# Patient Record
Sex: Female | Born: 1935 | Race: White | Hispanic: No | Smoking: Never smoker
Health system: Southern US, Community
[De-identification: ages and names within clinical notes are randomized; demographics above are authoritative.]

## PROBLEM LIST (undated history)

## (undated) DIAGNOSIS — F0390 Unspecified dementia without behavioral disturbance: Secondary | ICD-10-CM

## (undated) DIAGNOSIS — F32A Depression, unspecified: Secondary | ICD-10-CM

## (undated) DIAGNOSIS — T17908A Unspecified foreign body in respiratory tract, part unspecified causing other injury, initial encounter: Secondary | ICD-10-CM

## (undated) DIAGNOSIS — I4891 Unspecified atrial fibrillation: Secondary | ICD-10-CM

## (undated) DIAGNOSIS — K2970 Gastritis, unspecified, without bleeding: Secondary | ICD-10-CM

## (undated) DIAGNOSIS — F329 Major depressive disorder, single episode, unspecified: Secondary | ICD-10-CM

## (undated) DIAGNOSIS — D649 Anemia, unspecified: Secondary | ICD-10-CM

---

## 2003-06-30 ENCOUNTER — Other Ambulatory Visit: Payer: Self-pay

## 2004-05-20 ENCOUNTER — Ambulatory Visit: Payer: Self-pay | Admitting: Internal Medicine

## 2004-12-05 ENCOUNTER — Ambulatory Visit: Payer: Self-pay | Admitting: Internal Medicine

## 2004-12-17 ENCOUNTER — Ambulatory Visit: Payer: Self-pay | Admitting: Internal Medicine

## 2004-12-27 ENCOUNTER — Ambulatory Visit: Payer: Self-pay | Admitting: Internal Medicine

## 2005-01-26 ENCOUNTER — Ambulatory Visit: Payer: Self-pay | Admitting: Internal Medicine

## 2005-05-22 ENCOUNTER — Ambulatory Visit: Payer: Self-pay | Admitting: Internal Medicine

## 2005-07-14 ENCOUNTER — Other Ambulatory Visit: Payer: Self-pay

## 2005-07-14 ENCOUNTER — Ambulatory Visit: Payer: Self-pay | Admitting: Orthopaedic Surgery

## 2005-07-16 ENCOUNTER — Ambulatory Visit: Payer: Self-pay | Admitting: Orthopaedic Surgery

## 2005-11-20 ENCOUNTER — Ambulatory Visit: Payer: Self-pay | Admitting: Rheumatology

## 2006-02-04 ENCOUNTER — Ambulatory Visit: Payer: Self-pay | Admitting: Anesthesiology

## 2006-03-24 ENCOUNTER — Ambulatory Visit: Payer: Self-pay | Admitting: Unknown Physician Specialty

## 2006-05-22 ENCOUNTER — Ambulatory Visit: Payer: Self-pay | Admitting: Internal Medicine

## 2007-03-08 ENCOUNTER — Ambulatory Visit: Payer: Self-pay | Admitting: Family

## 2007-06-10 ENCOUNTER — Ambulatory Visit: Payer: Self-pay | Admitting: Unknown Physician Specialty

## 2007-06-22 ENCOUNTER — Ambulatory Visit: Payer: Self-pay | Admitting: Unknown Physician Specialty

## 2007-06-30 ENCOUNTER — Ambulatory Visit: Payer: Self-pay | Admitting: Internal Medicine

## 2007-09-16 ENCOUNTER — Ambulatory Visit: Payer: Self-pay | Admitting: Family

## 2008-07-03 ENCOUNTER — Ambulatory Visit: Payer: Self-pay | Admitting: Internal Medicine

## 2008-09-06 ENCOUNTER — Encounter: Payer: Self-pay | Admitting: Family Medicine

## 2008-09-26 ENCOUNTER — Encounter: Payer: Self-pay | Admitting: Family Medicine

## 2008-10-26 ENCOUNTER — Encounter: Payer: Self-pay | Admitting: Family Medicine

## 2009-07-04 ENCOUNTER — Ambulatory Visit: Payer: Self-pay | Admitting: Internal Medicine

## 2009-08-29 ENCOUNTER — Inpatient Hospital Stay: Payer: Self-pay | Admitting: Internal Medicine

## 2009-09-13 ENCOUNTER — Ambulatory Visit: Payer: Self-pay | Admitting: Unknown Physician Specialty

## 2009-10-11 ENCOUNTER — Ambulatory Visit: Payer: Self-pay | Admitting: Internal Medicine

## 2009-12-13 ENCOUNTER — Ambulatory Visit: Payer: Self-pay | Admitting: Unknown Physician Specialty

## 2010-01-14 ENCOUNTER — Ambulatory Visit: Payer: Self-pay | Admitting: Pain Medicine

## 2010-01-23 ENCOUNTER — Other Ambulatory Visit: Payer: Self-pay | Admitting: Pain Medicine

## 2010-01-23 ENCOUNTER — Ambulatory Visit: Payer: Self-pay | Admitting: Pain Medicine

## 2010-02-21 ENCOUNTER — Ambulatory Visit: Payer: Self-pay | Admitting: Pain Medicine

## 2010-03-04 ENCOUNTER — Ambulatory Visit: Payer: Self-pay | Admitting: Pain Medicine

## 2010-03-04 ENCOUNTER — Other Ambulatory Visit: Payer: Self-pay | Admitting: Pain Medicine

## 2010-03-25 ENCOUNTER — Ambulatory Visit: Payer: Self-pay | Admitting: Pain Medicine

## 2010-04-01 ENCOUNTER — Ambulatory Visit: Payer: Self-pay | Admitting: Pain Medicine

## 2010-04-05 ENCOUNTER — Ambulatory Visit: Payer: Self-pay | Admitting: Unknown Physician Specialty

## 2010-05-02 ENCOUNTER — Ambulatory Visit: Payer: Self-pay | Admitting: Pain Medicine

## 2010-05-08 ENCOUNTER — Ambulatory Visit: Payer: Self-pay | Admitting: Pain Medicine

## 2010-06-13 ENCOUNTER — Ambulatory Visit: Payer: Self-pay | Admitting: Pain Medicine

## 2010-06-26 ENCOUNTER — Encounter: Payer: Self-pay | Admitting: Pain Medicine

## 2010-06-27 ENCOUNTER — Encounter: Payer: Self-pay | Admitting: Pain Medicine

## 2010-07-01 ENCOUNTER — Ambulatory Visit: Payer: Self-pay | Admitting: Pain Medicine

## 2010-07-17 ENCOUNTER — Ambulatory Visit: Payer: Self-pay | Admitting: Internal Medicine

## 2010-07-19 ENCOUNTER — Ambulatory Visit: Payer: Self-pay | Admitting: Family Medicine

## 2010-07-30 ENCOUNTER — Ambulatory Visit: Payer: Self-pay | Admitting: Pain Medicine

## 2010-08-29 ENCOUNTER — Ambulatory Visit: Payer: Self-pay | Admitting: Pain Medicine

## 2010-09-02 ENCOUNTER — Ambulatory Visit: Payer: Self-pay | Admitting: Pain Medicine

## 2010-09-26 ENCOUNTER — Ambulatory Visit: Payer: Self-pay | Admitting: Pain Medicine

## 2010-11-07 ENCOUNTER — Ambulatory Visit: Payer: Self-pay | Admitting: Pain Medicine

## 2010-11-20 ENCOUNTER — Ambulatory Visit: Payer: Self-pay | Admitting: Pain Medicine

## 2010-12-24 ENCOUNTER — Ambulatory Visit: Payer: Self-pay | Admitting: Pain Medicine

## 2011-01-21 ENCOUNTER — Ambulatory Visit: Payer: Self-pay | Admitting: Pain Medicine

## 2011-01-27 ENCOUNTER — Ambulatory Visit: Payer: Self-pay | Admitting: Pain Medicine

## 2011-02-20 ENCOUNTER — Ambulatory Visit: Payer: Self-pay | Admitting: Pain Medicine

## 2011-03-03 ENCOUNTER — Ambulatory Visit: Payer: Self-pay | Admitting: Pain Medicine

## 2011-03-12 ENCOUNTER — Ambulatory Visit: Payer: Self-pay | Admitting: Pain Medicine

## 2011-03-25 ENCOUNTER — Ambulatory Visit: Payer: Self-pay | Admitting: Pain Medicine

## 2011-04-07 ENCOUNTER — Ambulatory Visit: Payer: Self-pay | Admitting: Pain Medicine

## 2011-05-22 ENCOUNTER — Ambulatory Visit: Payer: Self-pay | Admitting: Pain Medicine

## 2011-05-26 ENCOUNTER — Ambulatory Visit: Payer: Self-pay | Admitting: Pain Medicine

## 2011-06-19 ENCOUNTER — Ambulatory Visit: Payer: Self-pay | Admitting: Pain Medicine

## 2011-07-17 ENCOUNTER — Other Ambulatory Visit: Payer: Self-pay | Admitting: Pain Medicine

## 2011-07-17 ENCOUNTER — Ambulatory Visit: Payer: Self-pay | Admitting: Pain Medicine

## 2011-07-17 LAB — PROTIME-INR: INR: 1.2

## 2011-08-12 ENCOUNTER — Ambulatory Visit: Payer: Self-pay | Admitting: Internal Medicine

## 2011-08-19 ENCOUNTER — Ambulatory Visit: Payer: Self-pay | Admitting: Pain Medicine

## 2011-09-16 ENCOUNTER — Ambulatory Visit: Payer: Self-pay | Admitting: Pain Medicine

## 2011-09-17 ENCOUNTER — Ambulatory Visit: Payer: Self-pay | Admitting: Internal Medicine

## 2011-09-26 ENCOUNTER — Ambulatory Visit: Payer: Self-pay | Admitting: Surgery

## 2011-10-02 ENCOUNTER — Ambulatory Visit: Payer: Self-pay | Admitting: Internal Medicine

## 2011-10-02 LAB — PROTIME-INR: Prothrombin Time: 27.1 secs — ABNORMAL HIGH (ref 11.5–14.7)

## 2011-10-02 LAB — CBC CANCER CENTER
Basophil %: 0.2 %
HCT: 42.8 % (ref 35.0–47.0)
HGB: 14.4 g/dL (ref 12.0–16.0)
Lymphocyte #: 1.1 x10 3/mm (ref 1.0–3.6)
Lymphocyte %: 18.8 %
MCV: 100 fL (ref 80–100)
Neutrophil %: 71.3 %
Platelet: 175 x10 3/mm (ref 150–440)
RBC: 4.29 10*6/uL (ref 3.80–5.20)
RDW: 13.7 % (ref 11.5–14.5)

## 2011-10-02 LAB — HEPATIC FUNCTION PANEL A (ARMC)
Bilirubin,Total: 0.4 mg/dL (ref 0.2–1.0)
SGOT(AST): 44 U/L — ABNORMAL HIGH (ref 15–37)
SGPT (ALT): 52 U/L

## 2011-10-02 LAB — APTT: Activated PTT: 38.6 secs — ABNORMAL HIGH (ref 23.6–35.9)

## 2011-10-02 LAB — CREATININE, SERUM
Creatinine: 0.69 mg/dL (ref 0.60–1.30)
EGFR (African American): >60

## 2011-10-03 LAB — CANCER ANTIGEN 19-9: CA 19-9: 32 U/mL (ref 0–35)

## 2011-10-22 ENCOUNTER — Other Ambulatory Visit: Payer: Self-pay | Admitting: Pain Medicine

## 2011-10-22 ENCOUNTER — Ambulatory Visit: Payer: Self-pay | Admitting: Pain Medicine

## 2011-10-22 LAB — PROTIME-INR: Prothrombin Time: 13 secs (ref 11.5–14.7)

## 2011-10-27 ENCOUNTER — Ambulatory Visit: Payer: Self-pay | Admitting: Internal Medicine

## 2011-11-24 ENCOUNTER — Ambulatory Visit: Payer: Self-pay | Admitting: Surgery

## 2011-11-24 LAB — PROTIME-INR
INR: 0.9
Prothrombin Time: 12.3 secs (ref 11.5–14.7)

## 2011-11-25 LAB — HEMOGLOBIN: HGB: 13.2 g/dL (ref 12.0–16.0)

## 2011-11-25 LAB — PATHOLOGY REPORT

## 2012-01-06 ENCOUNTER — Ambulatory Visit: Payer: Self-pay | Admitting: Pain Medicine

## 2012-01-14 ENCOUNTER — Ambulatory Visit: Payer: Self-pay | Admitting: Pain Medicine

## 2012-02-10 ENCOUNTER — Ambulatory Visit: Payer: Self-pay | Admitting: Pain Medicine

## 2012-03-01 ENCOUNTER — Encounter: Payer: Self-pay | Admitting: Pain Medicine

## 2012-03-16 ENCOUNTER — Ambulatory Visit: Payer: Self-pay | Admitting: Pain Medicine

## 2012-03-28 ENCOUNTER — Encounter: Payer: Self-pay | Admitting: Pain Medicine

## 2012-04-15 ENCOUNTER — Ambulatory Visit: Payer: Self-pay | Admitting: Pain Medicine

## 2012-07-15 ENCOUNTER — Ambulatory Visit: Payer: Self-pay | Admitting: Pain Medicine

## 2012-10-20 ENCOUNTER — Ambulatory Visit: Payer: Self-pay

## 2013-01-18 ENCOUNTER — Ambulatory Visit: Payer: Self-pay | Admitting: Pain Medicine

## 2013-01-27 ENCOUNTER — Ambulatory Visit: Payer: Self-pay | Admitting: Pain Medicine

## 2013-03-01 ENCOUNTER — Ambulatory Visit: Payer: Self-pay | Admitting: Pain Medicine

## 2013-09-04 ENCOUNTER — Emergency Department: Payer: Self-pay | Admitting: Emergency Medicine

## 2013-09-09 DIAGNOSIS — F0390 Unspecified dementia without behavioral disturbance: Secondary | ICD-10-CM | POA: Diagnosis not present

## 2013-09-09 DIAGNOSIS — S0990XA Unspecified injury of head, initial encounter: Secondary | ICD-10-CM | POA: Diagnosis not present

## 2013-09-09 DIAGNOSIS — S0530XA Ocular laceration without prolapse or loss of intraocular tissue, unspecified eye, initial encounter: Secondary | ICD-10-CM

## 2013-09-09 DIAGNOSIS — Z9181 History of falling: Secondary | ICD-10-CM | POA: Diagnosis not present

## 2013-12-28 ENCOUNTER — Ambulatory Visit: Payer: Self-pay | Admitting: Family Medicine

## 2014-08-15 NOTE — Op Note (Signed)
PATIENT NAME:  Jamie Norton, [place] MR#:  [id number] DATE OF BIRTH:  04-01-1922  DATE OF PROCEDURE:  11/24/2011  PREOPERATIVE DIAGNOSIS: Chronic cholecystitis, possible neoplasm.   POSTOPERATIVE DIAGNOSIS: Chronic cholecystitis, possible neoplasm.   PROCEDURE: Laparoscopic cholecystectomy, cholangiogram.   SURGEON:  Renda Rolls, M.D.   ANESTHESIA: General.   INDICATIONS: This 81 year old female recently has had some right upper quadrant pain, ultrasound findings of thickening gallbladder wall, and MRCP findings of thickening of the gallbladder wall with suspicion of neoplasm, but no invasion of the liver identified. There was some extrahepatic biliary dilatation, but no stones were seen. Surgery was recommended for definitive treatment.   DESCRIPTION OF PROCEDURE: The patient was placed on the operating table in the supine position under general endotracheal anesthesia. The abdomen was prepared with ChloraPrep and draped in a sterile manner.   A short incision was made below the umbilicus at the site of an old scar, carried down through subcutaneous tissues. The deep fascia was grasped with a laryngeal hook and elevated. A Veress needle was inserted, aspirated, and irrigated with a saline solution. Next, the peritoneal cavity was inflated with carbon dioxide. The Veress needle was removed. The 10-mm cannula was inserted. The 10- mm, 0-degree laparoscope was inserted to view the peritoneal cavity. The liver appeared smooth, stomach appeared typical. Another incision was made in the epigastrium slightly to the right of the midline to introduce an 11-mm cannula. Two incisions were made in the lateral aspect of the right upper quadrant to introduce two 5-mm cannulas.   There were some adhesions between the gallbladder and the anterior abdominal wall. These were taken down with blunt and sharp dissection and use of electrocautery. Next, the gallbladder was retracted towards the right shoulder. It did  appear to have a somewhat thickened wall. A number of additional adhesions were taken down with blunt and sharp dissection. The neck of the gallbladder was mobilized with incision of the visceral peritoneum. The common hepatic duct and common bile duct were identified. The cystic duct appeared to be somewhat large in size, approximately 5 millimeters. It was dissected free from surrounding structures. The cystic artery was dissected free from surrounding structures. A critical view of safety was demonstrated. An endoclip was placed across the cystic duct adjacent to the neck of the gallbladder. An incision was made in the cystic duct to introduce a Reddick catheter. Half-strength Conray-60 dye was injected as the cholangiogram was done with fluoroscopy. There was one film that appeared to have approximately a 1-cm lucency within the common bile duct; however, subsequent films did not demonstrate that lucency. There was some dilatation of the biliary tree. There was prompt flow of dye into the duodenum. Next, the Reddick catheter was removed. The cystic duct was doubly ligated with endoclips and divided. Next, chromic Endoloop was placed around the cystic duct and further ligated. The gallbladder was dissected free from the liver with hook and cautery. There was one small cystic structure which was adjacent to the gallbladder which was included in the resection. There was no tumor encountered during the course of this dissection. The gallbladder was completely separated. The site was irrigated with heparinized saline solution and aspirated. Hemostasis appeared to be intact. Next, the gallbladder was delivered up through the infraumbilical incision. It was necessary to lengthen the incision by about 12 mm to allow removal of the gallbladder. There was a palpable mass within the gallbladder. It appeared that there was no tumor seen on the outer surface of the  gallbladder. Next, the operative site was further inspected.  Hemostasis was intact. The cannulas were removed, allowing carbon dioxide to escape from the peritoneal cavity. The fascial defect at the umbilicus was closed with interrupted 0 Vicryl figure-of-eight sutures, and also the fascia at the epigastric port was closed with 0 Vicryl simple suture. All skin incisions were closed with interrupted 5-0 chromic subcuticular sutures, benzoin, and Steri-Strips. Dressings were applied with paper tape. The patient tolerated surgery satisfactorily and was then prepared for transfer to the recovery room.   The gallbladder was opened on the side table and did appear to have neoplasm within it and a stricture at the midportion of the gallbladder. It was submitted in formalin for routine pathology.   ____________________________ Shela CommonsJ. Renda RollsWilton Smith, MD [email or web address] D: 11/24/2011 09:18:22 ET T: 11/24/2011 12:40:27 ET JOB#: [id number]  cc: Adella HareJ. Wilton Smith, MD, <Dictator> Adella HareWILTON J SMITH MD ELECTRONICALLY SIGNED 11/29/2011 10:43

## 2014-09-20 ENCOUNTER — Inpatient Hospital Stay
Admission: EM | Admit: 2014-09-20 | Discharge: 2014-09-27 | DRG: 177 | Disposition: A | Discharge status: Skilled Nursing Facility | Payer: Medicare Other | Attending: Internal Medicine | Admitting: Internal Medicine

## 2014-09-20 ENCOUNTER — Encounter: Payer: Self-pay | Admitting: Emergency Medicine

## 2014-09-20 ENCOUNTER — Emergency Department: Payer: Medicare Other

## 2014-09-20 DIAGNOSIS — Z888 Allergy status to other drugs, medicaments and biological substances status: Secondary | ICD-10-CM

## 2014-09-20 DIAGNOSIS — E876 Hypokalemia: Secondary | ICD-10-CM | POA: Diagnosis present

## 2014-09-20 DIAGNOSIS — R0902 Hypoxemia: Secondary | ICD-10-CM | POA: Diagnosis present

## 2014-09-20 DIAGNOSIS — I4891 Unspecified atrial fibrillation: Secondary | ICD-10-CM | POA: Diagnosis present

## 2014-09-20 DIAGNOSIS — R1311 Dysphagia, oral phase: Secondary | ICD-10-CM | POA: Diagnosis present

## 2014-09-20 DIAGNOSIS — Z7901 Long term (current) use of anticoagulants: Secondary | ICD-10-CM

## 2014-09-20 DIAGNOSIS — Z515 Encounter for palliative care: Secondary | ICD-10-CM | POA: Diagnosis not present

## 2014-09-20 DIAGNOSIS — Z66 Do not resuscitate: Secondary | ICD-10-CM | POA: Diagnosis present

## 2014-09-20 DIAGNOSIS — Z881 Allergy status to other antibiotic agents status: Secondary | ICD-10-CM | POA: Diagnosis not present

## 2014-09-20 DIAGNOSIS — D649 Anemia, unspecified: Secondary | ICD-10-CM | POA: Diagnosis present

## 2014-09-20 DIAGNOSIS — F0390 Unspecified dementia without behavioral disturbance: Secondary | ICD-10-CM | POA: Diagnosis present

## 2014-09-20 DIAGNOSIS — F329 Major depressive disorder, single episode, unspecified: Secondary | ICD-10-CM | POA: Diagnosis present

## 2014-09-20 DIAGNOSIS — J449 Chronic obstructive pulmonary disease, unspecified: Secondary | ICD-10-CM | POA: Diagnosis present

## 2014-09-20 DIAGNOSIS — J9601 Acute respiratory failure with hypoxia: Secondary | ICD-10-CM | POA: Diagnosis not present

## 2014-09-20 DIAGNOSIS — J690 Pneumonitis due to inhalation of food and vomit: Principal | ICD-10-CM | POA: Diagnosis present

## 2014-09-20 DIAGNOSIS — K2970 Gastritis, unspecified, without bleeding: Secondary | ICD-10-CM | POA: Diagnosis present

## 2014-09-20 DIAGNOSIS — Z79899 Other long term (current) drug therapy: Secondary | ICD-10-CM

## 2014-09-20 DIAGNOSIS — J189 Pneumonia, unspecified organism: Secondary | ICD-10-CM

## 2014-09-20 DIAGNOSIS — R1313 Dysphagia, pharyngeal phase: Secondary | ICD-10-CM | POA: Diagnosis present

## 2014-09-20 DIAGNOSIS — E860 Dehydration: Secondary | ICD-10-CM | POA: Diagnosis present

## 2014-09-20 HISTORY — DX: Anemia, unspecified: D64.9

## 2014-09-20 HISTORY — DX: Unspecified dementia, unspecified severity, without behavioral disturbance, psychotic disturbance, mood disturbance, and anxiety: F03.90

## 2014-09-20 HISTORY — DX: Unspecified atrial fibrillation: I48.91

## 2014-09-20 HISTORY — DX: Depression, unspecified: F32.A

## 2014-09-20 HISTORY — DX: Major depressive disorder, single episode, unspecified: F32.9

## 2014-09-20 HISTORY — DX: Gastritis, unspecified, without bleeding: K29.70

## 2014-09-20 LAB — URINALYSIS COMPLETE WITH MICROSCOPIC (ARMC ONLY)
BILIRUBIN URINE: NEGATIVE
Bacteria, UA: NONE SEEN
Glucose, UA: 50 mg/dL — AB
Hgb urine dipstick: NEGATIVE
KETONES UR: NEGATIVE mg/dL
Nitrite: NEGATIVE
Protein, ur: 30 mg/dL — AB
Specific Gravity, Urine: 1.021 (ref 1.005–1.030)
pH: 6 (ref 5.0–8.0)

## 2014-09-20 LAB — CBC WITH DIFFERENTIAL/PLATELET
BASOS ABS: 0 10*3/uL (ref 0–0.1)
Basophils Relative: 0 %
Eosinophils Absolute: 0 10*3/uL (ref 0–0.7)
Eosinophils Relative: 0 %
HEMATOCRIT: 45.9 % (ref 35.0–47.0)
HEMOGLOBIN: 15.1 g/dL (ref 12.0–16.0)
Lymphocytes Relative: 1 %
Lymphs Abs: 0.2 10*3/uL — ABNORMAL LOW (ref 1.0–3.6)
MCH: 31.7 pg (ref 26.0–34.0)
MCHC: 32.9 g/dL (ref 32.0–36.0)
MCV: 96.5 fL (ref 80.0–100.0)
Monocytes Absolute: 0.4 10*3/uL (ref 0.2–0.9)
Monocytes Relative: 2 %
Neutro Abs: 14.2 10*3/uL — ABNORMAL HIGH (ref 1.4–6.5)
Neutrophils Relative %: 97 %
Platelets: 222 10*3/uL (ref 150–440)
RBC: 4.75 MIL/uL (ref 3.80–5.20)
RDW: 14.9 % — AB (ref 11.5–14.5)
WBC: 14.8 10*3/uL — ABNORMAL HIGH (ref 3.6–11.0)

## 2014-09-20 LAB — COMPREHENSIVE METABOLIC PANEL
ALT: 48 U/L (ref 14–54)
ANION GAP: 14 (ref 5–15)
AST: 73 U/L — ABNORMAL HIGH (ref 15–41)
Albumin: 4 g/dL (ref 3.5–5.0)
Alkaline Phosphatase: 120 U/L (ref 38–126)
BUN: 30 mg/dL — ABNORMAL HIGH (ref 6–20)
CO2: 32 mmol/L (ref 22–32)
CREATININE: 0.91 mg/dL (ref 0.44–1.00)
Calcium: 9.5 mg/dL (ref 8.9–10.3)
Chloride: 95 mmol/L — ABNORMAL LOW (ref 101–111)
GFR calc Af Amer: >60 mL/min (ref >60)
GFR calc non Af Amer: 54 mL/min — ABNORMAL LOW (ref >60)
GLUCOSE: 184 mg/dL — AB (ref 65–99)
POTASSIUM: 3 mmol/L — AB (ref 3.5–5.1)
Sodium: 141 mmol/L (ref 135–145)
TOTAL PROTEIN: 8 g/dL (ref 6.5–8.1)
Total Bilirubin: 0.6 mg/dL (ref 0.3–1.2)

## 2014-09-20 LAB — TROPONIN I: Troponin I: 0.03 ng/mL (ref <0.031)

## 2014-09-20 LAB — APTT: APTT: 34 s (ref 24–36)

## 2014-09-20 LAB — PROTIME-INR
INR: 2.81
Prothrombin Time: 29.7 seconds — ABNORMAL HIGH (ref 11.4–15.0)

## 2014-09-20 LAB — LACTIC ACID, PLASMA: Lactic Acid, Venous: 4.4 mmol/L (ref 0.5–2.0)

## 2014-09-20 LAB — MAGNESIUM: Magnesium: 1.7 mg/dL (ref 1.7–2.4)

## 2014-09-20 MED ORDER — CLINDAMYCIN PHOSPHATE 300 MG/50ML IV SOLN
300.0000 mg | Freq: Three times a day (TID) | INTRAVENOUS | Status: DC
  Administered 2014-09-20 – 2014-09-21 (×2): 300 mg via INTRAVENOUS
  Filled 2014-09-20 (×6): qty 50

## 2014-09-20 MED ORDER — CLINDAMYCIN PHOSPHATE 600 MG/50ML IV SOLN
INTRAVENOUS | Status: AC
  Administered 2014-09-20: 600 mg via INTRAVENOUS
  Filled 2014-09-20: qty 50

## 2014-09-20 MED ORDER — LEVOFLOXACIN IN D5W 750 MG/150ML IV SOLN
750.0000 mg | Freq: Once | INTRAVENOUS | Status: AC
  Administered 2014-09-20: 750 mg via INTRAVENOUS

## 2014-09-20 MED ORDER — WARFARIN SODIUM 2 MG PO TABS: 2.0000 mg | ORAL_TABLET | ORAL | Status: DC

## 2014-09-20 MED ORDER — FUROSEMIDE 20 MG PO TABS: 20.0000 mg | ORAL_TABLET | Freq: Two times a day (BID) | ORAL | Status: DC

## 2014-09-20 MED ORDER — PNEUMOCOCCAL VAC POLYVALENT 25 MCG/0.5ML IJ INJ
0.5000 mL | INJECTION | INTRAMUSCULAR | Status: AC
  Administered 2014-09-21: 0.5 mL via INTRAMUSCULAR
  Filled 2014-09-20: qty 0.5

## 2014-09-20 MED ORDER — LEVOFLOXACIN IN D5W 750 MG/150ML IV SOLN
INTRAVENOUS | Status: AC
  Administered 2014-09-20: 750 mg via INTRAVENOUS
  Filled 2014-09-20: qty 150

## 2014-09-20 MED ORDER — SODIUM CHLORIDE 0.9 % IV SOLN
Freq: Once | INTRAVENOUS | Status: AC
  Administered 2014-09-20: 1000 mL via INTRAVENOUS

## 2014-09-20 MED ORDER — SERTRALINE HCL 50 MG PO TABS
50.0000 mg | ORAL_TABLET | Freq: Every day | ORAL | Status: DC
  Administered 2014-09-20 – 2014-09-26 (×7): 50 mg via ORAL
  Filled 2014-09-20 (×7): qty 1

## 2014-09-20 MED ORDER — SODIUM CHLORIDE 0.9 % IV SOLN: INTRAVENOUS | Status: DC

## 2014-09-20 MED ORDER — DONEPEZIL HCL 5 MG PO TABS
10.0000 mg | ORAL_TABLET | Freq: Every day | ORAL | Status: DC
  Administered 2014-09-20 – 2014-09-26 (×7): 10 mg via ORAL
  Filled 2014-09-20 (×7): qty 2

## 2014-09-20 MED ORDER — ENOXAPARIN SODIUM 40 MG/0.4ML ~~LOC~~ SOLN: 40.0000 mg | SUBCUTANEOUS | Status: DC

## 2014-09-20 MED ORDER — VANCOMYCIN HCL 500 MG IV SOLR
500.0000 mg | INTRAVENOUS | Status: DC
  Administered 2014-09-21: 04:00:00 500 mg via INTRAVENOUS
  Filled 2014-09-20 (×2): qty 500

## 2014-09-20 MED ORDER — PANTOPRAZOLE SODIUM 40 MG PO TBEC
40.0000 mg | DELAYED_RELEASE_TABLET | Freq: Every day | ORAL | Status: DC
  Administered 2014-09-20 – 2014-09-27 (×7): 40 mg via ORAL
  Filled 2014-09-20 (×7): qty 1

## 2014-09-20 MED ORDER — POTASSIUM CHLORIDE CRYS ER 20 MEQ PO TBCR
40.0000 meq | EXTENDED_RELEASE_TABLET | Freq: Once | ORAL | Status: AC
  Administered 2014-09-21: 09:00:00 40 meq via ORAL
  Filled 2014-09-20: qty 2

## 2014-09-20 MED ORDER — GABAPENTIN 100 MG PO CAPS: 100.0000 mg | ORAL_CAPSULE | Freq: Three times a day (TID) | ORAL | Status: DC | PRN

## 2014-09-20 MED ORDER — WARFARIN SODIUM 2 MG PO TABS: 3.0000 mg | ORAL_TABLET | ORAL | Status: DC

## 2014-09-20 MED ORDER — POTASSIUM CHLORIDE IN NACL 20-0.9 MEQ/L-% IV SOLN
INTRAVENOUS | Status: DC
  Administered 2014-09-20 – 2014-09-21 (×2): via INTRAVENOUS
  Filled 2014-09-20 (×4): qty 1000

## 2014-09-20 MED ORDER — CLINDAMYCIN PHOSPHATE 600 MG/50ML IV SOLN
600.0000 mg | Freq: Once | INTRAVENOUS | Status: AC
  Administered 2014-09-20: 600 mg via INTRAVENOUS

## 2014-09-20 MED ORDER — VANCOMYCIN HCL 500 MG IV SOLR
500.0000 mg | INTRAVENOUS | Status: AC
  Administered 2014-09-20: 22:00:00 500 mg via INTRAVENOUS
  Filled 2014-09-20: qty 500

## 2014-09-20 MED ORDER — DEXTROSE 5 % IV SOLN: 2.0000 g | Freq: Three times a day (TID) | INTRAVENOUS | Status: DC

## 2014-09-20 NOTE — ED Notes (Signed)
Patient is A+Ox3. Chest rise and fall equal and unlabored. Despite patient's SPO2 and history, patient remains without distress.  Patient in bed resting comfortably, VSWNL with O2 applied

## 2014-09-20 NOTE — ED Notes (Signed)
Patient aspirated while taking medications in AM.  Patient then aspirated pudding at lunch time.  Per RN at nursing facility pt has had difficulty controlling secretions and increased dysphagia.  RN reports generalized weakness today.  Alert and oriented to person and place is baseline.  RN also reports increasing confusion for last 1-2 weeks.  Went to [facility] and they sent pt here for low oxygen level.  Pt currently has UTI and is on abx

## 2014-09-20 NOTE — ED Provider Notes (Signed)
[facility] Emergency Department Provider Note  ____________________________________________  Time seen: Approximately 4 PM  I have reviewed the triage vital signs and the nursing notes.   HISTORY  Chief Complaint aspirated while eating pudding at lunch    HPI [name] is a 81 y.o. female with a history of atrial fibrillation and dementia who presents with 2 episodes of aspiration today and O2 saturations to the 70s. Per the nursing home patient has had a cognitive decline over the last 1-2 weeks. She was recently diagnosed with a urinary tract infection for which she was prescribed Bactrim.  Unclear cause of aspiration. She aspirated her medications this morning and then putting and medications at lunch today. Does not note any weakness or numbness. Denies any pain.    Past Medical History  Diagnosis Date  . Atrial fibrillation   . Dementia   . Gastritis   . Anemia   . Depression     There are no active problems to display for this patient.   History reviewed. No pertinent past surgical history.  Current Outpatient Rx  Name  Route  Sig  Dispense  Refill  . Calcium Citrate-Vitamin D (CITRACAL PETITES/VITAMIN D PO)   Oral   Take 2 tablets by mouth 2 (two) times daily.         . Carboxymethylcellulose Sodium (REFRESH LIQUIGEL OP)   Ophthalmic   Apply 1 application to eye daily.         . cholecalciferol (VITAMIN D) 1000 UNITS tablet   Oral   Take 1,000 Units by mouth daily.         Marland Kitchen donepezil (ARICEPT) 10 MG tablet   Oral   Take 10 mg by mouth at bedtime.         . furosemide (LASIX) 20 MG tablet   Oral   Take 20 mg by mouth 2 (two) times daily.         Marland Kitchen gabapentin (NEURONTIN) 100 MG capsule   Oral   Take 100 mg by mouth 3 (three) times daily as needed (for pain).         . Multiple Vitamins-Minerals (MULTIVITAMIN ADULTS 50+ PO)   Oral   Take 1 tablet by mouth daily.         Marland Kitchen omeprazole (PRILOSEC) 40 MG  capsule   Oral   Take 40 mg by mouth daily.         Bertram Gala Glycol-Propyl Glycol 0.4-0.3 % SOLN   Ophthalmic   Apply 1 drop to eye 2 (two) times daily.         . sertraline (ZOLOFT) 50 MG tablet   Oral   Take 50 mg by mouth at bedtime.         . sulfamethoxazole-trimethoprim (BACTRIM DS,SEPTRA DS) 800-160 MG per tablet   Oral   Take 1 tablet by mouth 2 (two) times daily.         . vitamin C (ASCORBIC ACID) 500 MG tablet   Oral   Take 500 mg by mouth daily.         Marland Kitchen warfarin (COUMADIN) 2 MG tablet   Oral   Take 2 mg by mouth daily. Pt takes  tablet on Monday-Wednesday-Friday. Pt takes  tablet on Tuesday-Thursday-Saturday-Sunday.         . warfarin (COUMADIN) 3 MG tablet   Oral   Take 3 mg by mouth daily. Pt takes  tablet on Tuesday-Thursday-Saturday-Sunday. Pt takes  tablet on Monday-Wednesday-Friday.  Allergies Amoxicillin; Doxycycline; Polytrim; and Tolmetin  History reviewed. No pertinent family history.  Social History History  Substance Use Topics  . Smoking status: Never Smoker   . Smokeless tobacco: Not on file  . Alcohol Use: No    Review of Systems Constitutional: No fever/chills Eyes: No visual changes. ENT: No sore throat. Cardiovascular: Denies chest pain. Respiratory: Denies shortness of breath. Gastrointestinal: No abdominal pain.  No nausea, no vomiting.  No diarrhea.  No constipation. Genitourinary: Negative for dysuria. Musculoskeletal: Negative for back pain. Skin: Negative for rash. Neurological: Negative for headaches, focal weakness or numbness.  10-point ROS otherwise negative.  ____________________________________________   PHYSICAL EXAM:  VITAL SIGNS: ED Triage Vitals  Enc Vitals Group     BP 09/20/14 1548 128/58 mmHg     Pulse Rate 09/20/14 1548 100     Resp 09/20/14 1548 16     Temp 09/20/14 1548 98.5 F (36.9 C)     Temp Source 09/20/14 1548 Oral     SpO2 09/20/14 1548 81 %      Weight 09/20/14 1548 100 lb (45.36 kg)     Height 09/20/14 1548 5\' 2"  (1.575 m)     Head Cir --      Peak Flow --      Pain Score --      Pain Loc --      Pain Edu? --      Excl. in GC? --     Constitutional: Alert and oriented. Well appearing and in no acute distress. Eyes: Conjunctivae are normal. PERRL. EOMI. Head: Atraumatic. Nose: No congestion/rhinnorhea. Mouth/Throat: Mucous membranes are moist.  Oropharynx non-erythematous. Neck: No stridor.   Cardiovascular:irregularly irregular. normal heart sounds.  Good peripheral circulation. Respiratory: Normal respiratory effort.  No retractions.  rhonchorous to the left lower field . Gastrointestinal: Soft and nontender. No distention. No abdominal bruits. No CVA tenderness.  Musculoskeletal: No lower extremity tenderness nor edema.  No joint effusions. Neurologic:  Normal speech and language.  unable to flex fingers to left hand. Unclear if this is acute or chronic. Patient unable to say. Speech is normal.  Skin:  Skin is warm, dry and intact. No rash noted. Psychiatric: Mood and affect are normal. Speech and behavior are normal.  NIH Stroke Scale    Time: 5:14 PM Person Administering Scale: Arelia LongestSchaevitz,  Rumor Sun M  Administer stroke scale items in the order listed. Record performance in each category after each subscale exam. Do not go back and change scores. Follow directions provided for each exam technique. Scores should reflect what the patient does, not what the clinician thinks the patient can do. The clinician should record answers while administering the exam and work quickly. Except where indicated, the patient should not be coached (i.e., repeated requests to patient to make a special effort).   1a  Level of consciousness: 0=alert; keenly responsive  1b. LOC questions:  0=Performs both tasks correctly  1c. LOC commands: 0=Performs both tasks correctly  2.  Best Gaze: 0=normal  3.  Visual: 0=No visual loss  4. Facial  Palsy: 0=Normal symmetric movement  5a.  Motor left arm: 0=No drift, limb holds 90 (or 45) degrees for full 10 seconds  5b.  Motor right arm: 0=No drift, limb holds 90 (or 45) degrees for full 10 seconds  6a. motor left leg: 0=No drift, limb holds 90 (or 45) degrees for full 10 seconds  6b  Motor right leg:  0=No drift, limb holds 90 (or 45) degrees for  full 10 seconds  7. Limb Ataxia: 0=Absent  8.  Sensory: 0=Normal; no sensory loss  9. Best Language:  0=No aphasia, normal  10. Dysarthria: 0=Normal  11. Extinction and Inattention: 0=No abnormality  12. Distal motor function: 1=At least some extension after 5 seconds, but is not fully extended. Any movement of the fingers which is not in response to a command is not scored   Total:   1    ____________________________________________   LABS (all labs ordered are listed, but only abnormal results are displayed)  Labs Reviewed  CBC WITH DIFFERENTIAL/PLATELET - Abnormal; Notable for the following:    WBC 14.8 (*)    RDW 14.9 (*)    Neutro Abs 14.2 (*)    Lymphs Abs 0.2 (*)    All other components within normal limits  COMPREHENSIVE METABOLIC PANEL - Abnormal; Notable for the following:    Potassium 3.0 (*)    Chloride 95 (*)    Glucose, Bld 184 (*)    BUN 30 (*)    AST 73 (*)    GFR calc non Af Amer 54 (*)    All other components within normal limits  PROTIME-INR - Abnormal; Notable for the following:    Prothrombin Time 29.7 (*)    All other components within normal limits  URINE CULTURE  CULTURE, BLOOD (ROUTINE X 2)  CULTURE, BLOOD (ROUTINE X 2)  TROPONIN I  APTT  LACTIC ACID, PLASMA  LACTIC ACID, PLASMA  URINALYSIS COMPLETEWITH MICROSCOPIC ([facility])    ____________________________________________  EKG pending ____________________________________________  RADIOLOGY  CT brain without acute disease. Chest x-ray with left lower lobe aspiration  pneumonia. ____________________________________________   PROCEDURES  Procedure(s) performed:   Critical Care performed:   ____________________________________________   INITIAL IMPRESSION / ASSESSMENT AND PLAN / ED COURSE  Pertinent labs & imaging results that were available during my care of the patient were reviewed by me and considered in my medical decision Patient with hypoxia likely secondary to aspiration. Considering aspiration pneumonitis first aspiration pneumonia. Unclear reason for aspiration. Possible decline secondary to UTI but cannot rule out stroke. Only focal neurologic deficit is to the left hand. However unclear if this is acute or chronic. The patient is unable to say. The patient is not a TPA candidate because her last normal mental status was 2 weeks ago.  ----------------------------------------- 5:15 PM on 09/20/2014 -----------------------------------------  Patient resting comfortably satting in the 90s on nasal cannula. Son is at the bedside. Discussed case with son says that has had ongoing issues with aspirating her pills. We'll treat with antibiotics and admitted to the hospital. ______________________________________   FINAL CLINICAL IMPRESSION(S) / ED DIAGNOSES  Aspiration pneumonia to the left lower lobe. Acute , initial visit. Acute hypoxia, initial visit.    Myrna Blazer, MD 09/20/14 [phone]

## 2014-09-20 NOTE — Progress Notes (Signed)
ANTICOAGULATION CONSULT NOTE - Initial Consult  Pharmacy Consult for Warfarin Indication: atrial fibrillation  Allergies  Allergen Reactions  . Amoxicillin Itching  . Doxycycline Other (See Comments)    Reaction: Unknown  . Polytrim [Polymyxin B-Trimethoprim] Other (See Comments)    Reaction: Unknown  . Tolmetin Itching    Patient Measurements: Height: 5\' 2"  (157.5 cm) Weight: 100 lb (45.36 kg) IBW/kg (Calculated) : 50.1   Vital Signs: Temp: 98.5 F (36.9 C) (05/25 1548) Temp Source: Oral (05/25 1548) BP: 146/67 mmHg (05/25 1800) Pulse Rate: 95 (05/25 1800)  Labs:  Recent Labs  09/20/14 1618  HGB 15.1  HCT 45.9  PLT 222  APTT 34  LABPROT 29.7*  INR 2.81  CREATININE 0.91  TROPONINI 0.03    Estimated Creatinine Clearance: 29.4 mL/min (by C-G formula based on Cr of 0.91).   Medical History: Past Medical History  Diagnosis Date  . Atrial fibrillation   . Dementia   . Gastritis   . Anemia   . Depression     Medications:  Scheduled:  . potassium chloride  40 mEq Oral Once  . [START ON 09/22/2014] warfarin  2 mg Oral Q M,W,F-1800  . [START ON 09/21/2014] warfarin  3 mg Oral Q T,Th,S,Su-1800    Assessment: Patient with afib on Coumadin 2 mg MWF and 3 mg TThSaSu PTA with therapeutic INR.   Goal of Therapy:  INR 2-3   Plan:  Will continue outpatient dosing of Coumadin and f/u AM labs.   Luisa HartChristy, Scout Guyett D 09/20/2014,6:43 PM

## 2014-09-20 NOTE — H&P (Addendum)
[facility] [place] at [facility]   PATIENT NAME: Jamie Norton    MR#:  [id number]  DATE OF BIRTH:  26-Mar-1922  DATE OF ADMISSION:  09/20/2014  PRIMARY CARE PHYSICIAN: Ruthe Mannan, MD   REQUESTING/REFERRING PHYSICIAN:  Dr. Pershing Proud.  CHIEF COMPLAINT:   Chief Complaint  Patient presents with  . aspirated while eating pudding at lunch    aspirated while eating pudding at lunch    HISTORY OF PRESENT ILLNESS:  Jamie Norton  is a 81 y.o. female with a known history of A. fib, dementia and anemia. The patient was sent from nursing home to the ED due to 2 episodes of aspiration during lunch today. The patient O2 saturation was 70s. The patient is alert awake oriented 3. She complains of a cough and shortness of breath, but denies any other symptoms. The chest x-ray show left sided aspiration pneumonia.  PAST MEDICAL HISTORY:   Past Medical History  Diagnosis Date  . Atrial fibrillation   . Dementia   . Gastritis   . Anemia   . Depression     PAST SURGICAL HISTORY:  History reviewed. No pertinent past surgical history.  SOCIAL HISTORY:   History  Substance Use Topics  . Smoking status: Never Smoker   . Smokeless tobacco: Not on file  . Alcohol Use: No    FAMILY HISTORY:  History reviewed. No pertinent family history. the patient doesn't know her family history.  DRUG ALLERGIES:   Allergies  Allergen Reactions  . Amoxicillin Itching  . Doxycycline Other (See Comments)    Reaction: Unknown  . Polytrim [Polymyxin B-Trimethoprim] Other (See Comments)    Reaction: Unknown  . Tolmetin Itching    REVIEW OF SYSTEMS:  CONSTITUTIONAL: No fever, fatigue or weakness.  EYES: No blurred or double vision.  EARS, NOSE, AND THROAT: No tinnitus or ear pain.  RESPIRATORY: Positive for cough and shortness of breath, no wheezing or hemoptysis.  CARDIOVASCULAR: No chest pain, orthopnea, edema.  GASTROINTESTINAL: No nausea, vomiting, diarrhea or  abdominal pain.  GENITOURINARY: No dysuria, hematuria.  ENDOCRINE: No polyuria, nocturia,  HEMATOLOGY: No anemia, easy bruising or bleeding SKIN: No rash or lesion. MUSCULOSKELETAL: No joint pain or arthritis.   NEUROLOGIC: No tingling, numbness, weakness.  PSYCHIATRY: No anxiety or depression.   MEDICATIONS AT HOME:   Prior to Admission medications   Medication Sig Start Date End Date Taking? Authorizing Provider  Calcium Citrate-Vitamin D (CITRACAL PETITES/VITAMIN D PO) Take 2 tablets by mouth 2 (two) times daily.   Yes Historical Provider, MD  Carboxymethylcellulose Sodium (REFRESH LIQUIGEL OP) Apply 1 application to eye daily.   Yes Historical Provider, MD  cholecalciferol (VITAMIN D) 1000 UNITS tablet Take 1,000 Units by mouth daily.   Yes Historical Provider, MD  donepezil (ARICEPT) 10 MG tablet Take 10 mg by mouth at bedtime.   Yes Historical Provider, MD  furosemide (LASIX) 20 MG tablet Take 20 mg by mouth 2 (two) times daily.   Yes Historical Provider, MD  gabapentin (NEURONTIN) 100 MG capsule Take 100 mg by mouth 3 (three) times daily as needed (for pain).   Yes Historical Provider, MD  Multiple Vitamins-Minerals (MULTIVITAMIN ADULTS 50+ PO) Take 1 tablet by mouth daily.   Yes Historical Provider, MD  omeprazole (PRILOSEC) 40 MG capsule Take 40 mg by mouth daily.   Yes Historical Provider, MD  Polyethyl Glycol-Propyl Glycol 0.4-0.3 % SOLN Apply 1 drop to eye 2 (two) times daily.   Yes Historical Provider, MD  sertraline (ZOLOFT) 50 MG tablet Take 50 mg by mouth at bedtime.   Yes Historical Provider, MD  sulfamethoxazole-trimethoprim (BACTRIM DS,SEPTRA DS) 800-160 MG per tablet Take 1 tablet by mouth 2 (two) times daily. 09/19/14 09/21/14 Yes Historical Provider, MD  vitamin C (ASCORBIC ACID) 500 MG tablet Take 500 mg by mouth daily.   Yes Historical Provider, MD  warfarin (COUMADIN) 2 MG tablet Take 2 mg by mouth daily. Pt takes  tablet on Monday-Wednesday-Friday. Pt takes   tablet on Tuesday-Thursday-Saturday-Sunday.   Yes Historical Provider, MD  warfarin (COUMADIN) 3 MG tablet Take 3 mg by mouth daily. Pt takes  tablet on Tuesday-Thursday-Saturday-Sunday. Pt takes  tablet on Monday-Wednesday-Friday.   Yes Historical Provider, MD      VITAL SIGNS:  Blood pressure 146/67, pulse 95, temperature 98.5 F (36.9 C), temperature source Oral, resp. rate 16, height  (1.575 m), weight 45.36 kg (100 lb), SpO2 96 %.  PHYSICAL EXAMINATION:  GENERAL:  81 y.o.-year-old patient lying in the bed with no acute distress.  EYES: Pupils equal, round, reactive to light and accommodation. No scleral icterus. Extraocular muscles intact.  HEENT: Head atraumatic, normocephalic. Oropharynx and nasopharynx clear.  NECK:  Supple, no jugular venous distention. No thyroid enlargement, no tenderness.  LUNGS: Normal breath sounds bilaterally, no wheezing, left-sided crackles. No use of accessory muscles of respiration.  CARDIOVASCULAR: S1, S2 normal. No murmurs, rubs, or gallops.  ABDOMEN: Soft, nontender, nondistended. Bowel sounds present. No organomegaly or mass.  EXTREMITIES: No pedal edema, cyanosis, or clubbing.  NEUROLOGIC: Cranial nerves II through XII are intact. Muscle strength 5/5 in all extremities. Sensation intact. Gait not checked.  PSYCHIATRIC: The patient is alert and oriented x 3.  SKIN: No obvious rash, lesion, or ulcer.   LABORATORY PANEL:   CBC  Recent Labs Lab 09/20/14 1618  WBC 14.8*  HGB 15.1  HCT 45.9  PLT 222   ------------------------------------------------------------------------------------------------------------------  Chemistries   Recent Labs Lab 09/20/14 1618  NA 141  K 3.0*  CL 95*  CO2 32  GLUCOSE 184*  BUN 30*  CREATININE 0.91  CALCIUM 9.5  AST 73*  ALT 48  ALKPHOS 120  BILITOT 0.6   ------------------------------------------------------------------------------------------------------------------  Cardiac  Enzymes  Recent Labs Lab 09/20/14 1618  TROPONINI 0.03   ------------------------------------------------------------------------------------------------------------------  RADIOLOGY:  Dg Chest 1 View  09/20/2014   CLINICAL DATA:  Multiple aspiration episodes, hypoxia.  EXAM: CHEST  1 VIEW  COMPARISON:  CT scan of the chest of October 11, 2009  FINDINGS: There is dense infiltrate involving the left lower lobe. The left heart border remains faintly visible. The right lung is adequately inflated without focal infiltrate. There is no pulmonary vascular congestion. There is no definite pleural effusion. There is acute angulation of the upper thoracic spine in a reverse S-shaped configuration which is stable.  IMPRESSION: Findings consistent with aspiration pneumonia in the left lower lobe. There is underlying COPD. A PA and lateral chest x-ray would be useful when the patient can tolerate the procedure.   Electronically Signed   By: David  [place] M.D.   On: 09/20/2014 16:53   Ct Head Wo Contrast  09/20/2014   CLINICAL DATA:  Increasing confusion and dysphagia ; aspirated earlier study  EXAM: CT HEAD WITHOUT CONTRAST  TECHNIQUE: Contiguous axial images were obtained from the base of the skull through the vertex without intravenous contrast.  COMPARISON:  Sep 04, 2013  FINDINGS: There is mild diffuse atrophy. There is no intracranial mass, hemorrhage, extra-axial fluid collection,  or midline shift. There is mild small vessel disease in the centra semiovale bilaterally. There is no new gray-white compartment lesion. No acute infarct apparent. The bony calvarium appears intact. The mastoid air cells are clear.  IMPRESSION: Atrophy with mild periventricular small vessel disease. No intracranial mass, hemorrhage, or acute appearing infarct.   Electronically Signed   By: Bretta BangWilliam  Woodruff III M.D.   On: 09/20/2014 16:36    EKG:  No orders found for this or any previous visit.  IMPRESSION AND PLAN:    Aspiration pneumonia on left lower lobe. Hypoxia Leukocytosis Dehydration Hypokalemia A. fib Dementia   Plan: The patient will be admitted to medical floor. She was treated with the clindamycin 1 dose in the ED. I will continue clindamycin, follow-up CBC, blood culture and sputum culture. In addition, I will start nebulizer treatment with oxygen by nasal cannular. Dehydration, I will start normal saline IV and follow-up BMP. Hypokalemia, I will give potassium supplement and follow-up potassium level and magnesium level. A. fib, I will continue Coumadin dose per pharmacy. Dementia, aspiration and fall precaution.    All the records are reviewed and case discussed with ED provider. Management plans discussed with the patient, the patient wants full code.  CODE STATUS: Full code  TOTAL TIME TAKING CARE OF THIS PATIENT: 57 minutes.    Shaune Pollackhen, Maicey Barrientez M.D on 09/20/2014 at 6:26 PM  Between 7am to 6pm - Pager - [phone]  After 6pm go to [email or web address] - password [email or web address] [email or web address]  [facility]  Office  [phone]  CC: Primary care physician; Ruthe Mannanalia Aron, MD

## 2014-09-20 NOTE — Progress Notes (Signed)
ANTIBIOTIC CONSULT NOTE - INITIAL  Pharmacy Consult for Vancomycin Indication: pneumonia  Allergies  Allergen Reactions  . Amoxicillin Itching  . Doxycycline Other (See Comments)    Reaction: Unknown  . Polytrim [Polymyxin B-Trimethoprim] Other (See Comments)    Reaction: Unknown  . Tolmetin Itching    Patient Measurements: Height: 5\' 2"  (157.5 cm) Weight: 101 lb (45.813 kg) IBW/kg (Calculated) : 50.1  Vital Signs: Temp: 98.3 F (36.8 C) (05/25 1942) Temp Source: Oral (05/25 1942) BP: 146/86 mmHg (05/25 1942) Pulse Rate: 103 (05/25 1942) Intake/Output from previous day:   Intake/Output from this shift: Total I/O In: -  Out: 100 [Urine:100]  Labs:  Recent Labs  09/20/14 1618  WBC 14.8*  HGB 15.1  PLT 222  CREATININE 0.91   Estimated Creatinine Clearance: 29.7 mL/min (by C-G formula based on Cr of 0.91). No results for input(s): VANCOTROUGH, VANCOPEAK, VANCORANDOM, GENTTROUGH, GENTPEAK, GENTRANDOM, TOBRATROUGH, TOBRAPEAK, TOBRARND, AMIKACINPEAK, AMIKACINTROU, AMIKACIN in the last 72 hours.   Microbiology: No results found for this or any previous visit (from the past 720 hour(s)).  Medical History: Past Medical History  Diagnosis Date  . Atrial fibrillation   . Dementia   . Gastritis   . Anemia   . Depression     Medications:  Scheduled:  . clindamycin (CLEOCIN) IV  300 mg Intravenous 3 times per day  . donepezil  10 mg Oral QHS  . pantoprazole  40 mg Oral Daily  . potassium chloride  40 mEq Oral Once  . sertraline  50 mg Oral QHS  . vancomycin  500 mg Intravenous STAT  . [START ON 09/21/2014] vancomycin  500 mg Intravenous Q24H  . [START ON 09/22/2014] warfarin  2 mg Oral Q M,W,F-1800  . [START ON 09/21/2014] warfarin  3 mg Oral Q T,Th,S,Su-1800   Infusions:  . 0.9 % NaCl with KCl 20 mEq / L     Anti-infectives    Start     Dose/Rate Route Frequency Ordered Stop   09/21/14 0300  vancomycin (VANCOCIN) 500 mg in sodium chloride 0.9 % 100 mL IVPB      500 mg 100 mL/hr over 60 Minutes Intravenous Every 24 hours 09/20/14 2001     09/20/14 2200  aztreonam (AZACTAM) 2 g in dextrose 5 % 50 mL IVPB  Status:  Discontinued     2 g 100 mL/hr over 30 Minutes Intravenous 3 times per day 09/20/14 1940 09/20/14 1955   09/20/14 2200  clindamycin (CLEOCIN) IVPB 300 mg     300 mg 100 mL/hr over 30 Minutes Intravenous 3 times per day 09/20/14 1918     09/20/14 2015  vancomycin (VANCOCIN) 500 mg in sodium chloride 0.9 % 100 mL IVPB     500 mg 100 mL/hr over 60 Minutes Intravenous STAT 09/20/14 2001 09/21/14 2015   09/20/14 1715  clindamycin (CLEOCIN) IVPB 600 mg     600 mg 100 mL/hr over 30 Minutes Intravenous  Once 09/20/14 1711 09/20/14 1851   09/20/14 1715  levofloxacin (LEVAQUIN) IVPB 750 mg     750 mg 100 mL/hr over 90 Minutes Intravenous  Once 09/20/14 1711 09/20/14 1925     Assessment: Patient with LLL aspiration PNA ordered clindamycin. Indication for vancomycin is unclear.   Goal of Therapy:  Vancomycin trough level 15-20 mcg/ml  Plan:  Will begin vancomycin 500 mg iv q 24 h with stacked dosing and plan on checking a trough with the 5th dose.   Luisa HartChristy, Mariavictoria Nottingham D 09/20/2014,8:11 PM

## 2014-09-21 DIAGNOSIS — J690 Pneumonitis due to inhalation of food and vomit: Secondary | ICD-10-CM | POA: Diagnosis not present

## 2014-09-21 LAB — MRSA PCR SCREENING: MRSA by PCR: NEGATIVE

## 2014-09-21 LAB — PROTIME-INR
INR: 3.8
Prothrombin Time: 37.4 seconds — ABNORMAL HIGH (ref 11.4–15.0)

## 2014-09-21 MED ORDER — RISAQUAD PO CAPS
1.0000 | ORAL_CAPSULE | Freq: Two times a day (BID) | ORAL | Status: DC
  Administered 2014-09-21 – 2014-09-27 (×12): 1 via ORAL
  Filled 2014-09-21 (×14): qty 1

## 2014-09-21 MED ORDER — ENSURE ENLIVE PO LIQD
237.0000 mL | Freq: Two times a day (BID) | ORAL | Status: DC
  Administered 2014-09-21 – 2014-09-27 (×11): 237 mL via ORAL

## 2014-09-21 MED ORDER — CLINDAMYCIN PALMITATE HCL 75 MG/5ML PO SOLR
300.0000 mg | Freq: Three times a day (TID) | ORAL | Status: DC
Start: 2014-09-21 — End: 2014-09-22
  Administered 2014-09-21 – 2014-09-22 (×3): 300 mg via ORAL
  Filled 2014-09-21 (×6): qty 20

## 2014-09-21 MED ORDER — POLYETHYLENE GLYCOL 3350 17 G PO PACK
17.0000 g | PACK | Freq: Every day | ORAL | Status: DC
  Administered 2014-09-21 – 2014-09-27 (×4): 17 g via ORAL
  Filled 2014-09-21 (×6): qty 1

## 2014-09-21 MED ORDER — CARVEDILOL 3.125 MG PO TABS
3.1250 mg | ORAL_TABLET | Freq: Two times a day (BID) | ORAL | Status: DC
  Administered 2014-09-21 – 2014-09-27 (×11): 3.125 mg via ORAL
  Filled 2014-09-21 (×11): qty 1

## 2014-09-21 NOTE — Plan of Care (Signed)
Problem: SLP Dysphagia Goals Goal: Misc Dysphagia Goal Pt will safely tolerate po diet of least restrictive consistency w/ no overt s/s of aspiration noted by Staff/pt/family x3 sessions.    

## 2014-09-21 NOTE — Evaluation (Signed)
Clinical/Bedside Swallow Evaluation Patient Details  Name: Jamie Norton MRN: [id number] Date of Birth: March 03, 1922  Today's Date: 09/21/2014 Time: SLP Start Time (ACUTE ONLY): 0900 SLP Stop Time (ACUTE ONLY): 1000 SLP Time Calculation (min) (ACUTE ONLY): 60 min  Past Medical History:  Past Medical History  Diagnosis Date  . Atrial fibrillation   . Dementia   . Gastritis   . Anemia   . Depression    Past Surgical History: History reviewed. No pertinent past surgical history. HPI:  Per H/P, pt had episodes of choking when taking her pills x2 w/ NSG at her living facility. When asked, pt agreed that it was "hard" to swallow pills "sometimes"; she denied having any trouble eating her meals. Noted min. HOH. She only responded to direct questions vs. open-ended questions.   Assessment / Plan / Recommendation Clinical Impression  Pt presented w/ adequate pharyngeal phase swallow function; mild oral phase swallow function c/b min. increased in mastication time/effort w/ foods/solids. Given time and as she alternateed w/ small sips of liquids, pt cleared orally b/t trials/boluses. She appeared to use the small sip of liquid consistently as if this could be baseline for her sec. to her Cognitive decline/Dementia. No overt s/s of aspiration noted w/ the po trials eaten at her breakfast meal. Pt fed self w/ setup A. Pt stated she did not have much appetite(determined she liked cereal in the mornings). Pt would benefit from a mech soft diet w/ thin liquids for easier mastication. Rec. aspiration precautions; tray setup at meals. Meds in Puree - crushed if nec/able.     Aspiration Risk  Mild (sec. to her baseline Cognitive decline)    Diet Recommendation Dysphagia 3 (Mech soft);Thin   Medication Administration: Whole meds with puree (or crushed in puree if nec/able to crush) Compensations: Slow rate;Small sips/bites    Other  Recommendations Recommended Consults: Other (Comment) (Dietician  consult) Oral Care Recommendations: Oral care BID Other Recommendations: Other (Comment) (no large straws)   Follow Up Recommendations       Frequency and Duration min 3x week  1 week   Pertinent Vitals/Pain denied    SLP Swallow Goals  see care plan   Swallow Study Prior Functional Status       General Date of Onset: 09/20/14 Other Pertinent Information: Per H/P, pt had episodes of choking when taking her pills x2 w/ NSG at her living facility. When asked, pt agreed that it was "hard" to swallow pills "sometimes"; she denied having any trouble eating her meals. Noted min. HOH. She only responded to direct questions vs. open-ended questions. Type of Study: Bedside swallow evaluation Previous Swallow Assessment: none indicated Diet Prior to this Study: Regular;Thin liquids Temperature Spikes Noted: No Respiratory Status: Supplemental O2 delivered via (comment) (2 liters) Behavior/Cognition: Alert;Cooperative;Pleasant mood;Confused;Requires cueing Oral Cavity - Dentition: Adequate natural dentition/normal for age Self-Feeding Abilities: Able to feed self;Needs set up Patient Positioning: Upright in bed Baseline Vocal Quality: Normal;Low vocal intensity Volitional Cough: Strong Volitional Swallow: Able to elicit    Oral/Motor/Sensory Function Overall Oral Motor/Sensory Function: Appears within functional limits for tasks assessed Labial ROM: Within Functional Limits Labial Symmetry: Within Functional Limits Labial Strength: Within Functional Limits Lingual ROM: Within Functional Limits Lingual Symmetry: Within Functional Limits Lingual Strength: Within Functional Limits Facial Symmetry: Within Functional Limits Velum: Within Functional Limits Mandible: Within Functional Limits   Ice Chips Ice chips: Not tested (pt was already eating breakfast meal)   Thin Liquid Thin Liquid: Within functional limits Presentation: Straw (  10+ trials via straw she fed herself) Other  Comments:  (she tended to take small sips along w/ her bites of food)    Nectar Thick Nectar Thick Liquid: Not tested   Honey Thick Honey Thick Liquid: Not tested   Puree Puree: Within functional limits Presentation: Spoon;Self Fed (x3 trials)   Solid   GO    Solid: Impaired Presentation: Spoon;Self Fed (x8) Oral Phase Impairments:  (increased mastication time w/ boluses) Oral Phase Functional Implications: Other (comment) (she tended to use sips of liquids to aid clearing of foods) Pharyngeal Phase Impairments: Other (comments) (NONE; vocal quality clear b/t trials; no coughing) Other Comments:  (no throat clearing or decline/change in respiratory status)       Kristianna Saperstein 09/21/2014,10:12 AM

## 2014-09-21 NOTE — Plan of Care (Signed)
Problem: Discharge Progression Outcomes Goal: Discharge plan in place and appropriate Individualization Outcome: Progressing Pt goes by Jamie [facility]. [facility] She has a hx of Atrial fibrillation, Dementia, Gastritis, Anemia, Depression and is on home medications.        Goal: Other Discharge Outcomes/Goals Outcome: Progressing Pt alert and oriented, she is x1 assist to bsc, lungs diminished, on 5L 02. She has not complained of any pain. Toileting offered during hourly rounding, pt is a high fall risk.

## 2014-09-21 NOTE — Plan of Care (Signed)
Problem: Discharge Progression Outcomes Goal: Other Discharge Outcomes/Goals Outcome: Progressing Plan of Care Progressing to Goal: Patient receiving IV antibiotics. Patient remains on 5L oxygen. No complaints of pain. Speech eval performed, DY3 diet order. Patient tolerated diet, no complaints of nausea. SCDs on. PT eval pending.

## 2014-09-21 NOTE — Plan of Care (Signed)
Problem: Discharge Progression Outcomes Goal: Barriers To Progression Addressed/Resolved Individualization Outcome: Progressing Pt goes by [name].  [facility].  She has a hx of Atrial fibrillation, Dementia, Gastritis, Anemia, Depression and controlled with home medications.     Goal: Other Discharge Outcomes/Goals Outcome: Progressing Plan of Care Progressing to Goal:

## 2014-09-21 NOTE — Progress Notes (Signed)
ANTICOAGULATION CONSULT NOTE - Follow Up Consult  Pharmacy Consult for Coumadin Indication: atrial fibrillation  Allergies  Allergen Reactions  . Amoxicillin Itching  . Doxycycline Other (See Comments)    Reaction: Unknown  . Polytrim [Polymyxin B-Trimethoprim] Other (See Comments)    Reaction: Unknown  . Tolmetin Itching    Patient Measurements: Height:  (157.5 cm) Weight: 101 lb (45.813 kg) IBW/kg (Calculated) : 50.1 Heparin Dosing Weight: n/a  Vital Signs: Temp: 99.3 F (37.4 C) (05/26 0445) Temp Source: Oral (05/26 0445) BP: 114/45 mmHg (05/26 0445) Pulse Rate: 93 (05/26 0445)  Labs:  Recent Labs  09/20/14 1618 09/21/14 0448  HGB 15.1  --   HCT 45.9  --   PLT 222  --   APTT 34  --   LABPROT 29.7* 37.4*  INR 2.81 3.80  CREATININE 0.91  --   TROPONINI 0.03  --     Estimated Creatinine Clearance: 29.7 mL/min (by C-G formula based on Cr of 0.91).   Medications:  Prescriptions prior to admission  Medication Sig Dispense Refill Last Dose  . Calcium Citrate-Vitamin D (CITRACAL PETITES/VITAMIN D PO) Take 2 tablets by mouth 2 (two) times daily.   09/20/2014 at 0800  . Carboxymethylcellulose Sodium (REFRESH LIQUIGEL OP) Apply 1 application to eye daily.   09/19/2014 at 2200  . cholecalciferol (VITAMIN D) 1000 UNITS tablet Take 1,000 Units by mouth daily.   09/20/2014 at 0800  . donepezil (ARICEPT) 10 MG tablet Take 10 mg by mouth at bedtime.   09/19/2014 at 2200  . furosemide (LASIX) 20 MG tablet Take 20 mg by mouth 2 (two) times daily.   09/20/2014 at 0700  . gabapentin (NEURONTIN) 100 MG capsule Take 100 mg by mouth 3 (three) times daily as needed (for pain).   PRN at PRN  . Multiple Vitamins-Minerals (MULTIVITAMIN ADULTS 50+ PO) Take 1 tablet by mouth daily.   09/20/2014 at 0800  . omeprazole (PRILOSEC) 40 MG capsule Take 40 mg by mouth daily.   09/20/2014 at Unknown time  . Polyethyl Glycol-Propyl Glycol 0.4-0.3 % SOLN Apply 1 drop to eye 2 (two) times daily.    09/20/2014 at 0800  . sertraline (ZOLOFT) 50 MG tablet Take 50 mg by mouth at bedtime.   09/19/2014 at 2200  . sulfamethoxazole-trimethoprim (BACTRIM DS,SEPTRA DS) 800-160 MG per tablet Take 1 tablet by mouth 2 (two) times daily.   09/20/2014 at 0800  . vitamin C (ASCORBIC ACID) 500 MG tablet Take 500 mg by mouth daily.   09/20/2014 at 0800  . warfarin (COUMADIN) 2 MG tablet Take 2 mg by mouth daily. Pt takes  tablet on Monday-Wednesday-Friday. Pt takes  tablet on Tuesday-Thursday-Saturday-Sunday.   09/18/2014 at 1645  . warfarin (COUMADIN) 3 MG tablet Take 3 mg by mouth daily. Pt takes  tablet on Tuesday-Thursday-Saturday-Sunday. Pt takes  tablet on Monday-Wednesday-Friday.   09/19/2014 at 1645   Scheduled:  . clindamycin (CLEOCIN) IV  300 mg Intravenous 3 times per day  . donepezil  10 mg Oral QHS  . pantoprazole  40 mg Oral Daily  . sertraline  50 mg Oral QHS  . vancomycin  500 mg Intravenous Q24H    Assessment: Patient on Coumadin for a-fib PTA, home dose of  on MWF and  on TThSaSu. Today's INR is elevated at 3.8.  Goal of Therapy:  INR 2-3 Monitor platelets by anticoagulation protocol: Yes   Plan:  Will hold today's Coumadin dose and follow up on am labs.  Clovia Cuff, PharmD,  BCPS 09/21/2014 10:58 AM

## 2014-09-21 NOTE — Progress Notes (Signed)
[facility] - [place] at [facility]   PATIENT NAME: Jamie ManLetitia Norton    MR#:  [id number]  DATE OF BIRTH:  [birth year]  SUBJECTIVE:  CHIEF COMPLAINT:   Chief Complaint  Patient presents with  . aspirated while eating pudding at lunch    aspirated while eating pudding at lunch   Doing well. Eating breakfast this morning. Speech therapy has evaluated the patient. Patient doesn't have any trouble swallowing food, but she has trouble swallowing pills. So pills crushed in applesauce is recommended. She was on 5 L oxygen yesterday, now weaned down to 2 L. Her breathing has improved.  REVIEW OF SYSTEMS:  Review of Systems  Constitutional: Negative for fever and chills.  Eyes: Negative for blurred vision.  Respiratory: Positive for cough and shortness of breath. Negative for wheezing.   Cardiovascular: Negative for chest pain and palpitations.  Gastrointestinal: Negative for nausea, vomiting, abdominal pain, diarrhea and constipation.  Genitourinary: Negative for dysuria.  Neurological: Negative for dizziness, seizures and headaches.    DRUG ALLERGIES:   Allergies  Allergen Reactions  . Amoxicillin Itching  . Doxycycline Other (See Comments)    Reaction: Unknown  . Polytrim [Polymyxin B-Trimethoprim] Other (See Comments)    Reaction: Unknown  . Tolmetin Itching    VITALS:  Blood pressure 145/43, pulse 109, temperature 97.6 F (36.4 C), temperature source Oral, resp. rate 20, height 5\' 2"  (1.575 m), weight 45.813 kg (101 lb), SpO2 90 %.  PHYSICAL EXAMINATION:  Physical Exam  GENERAL:  81 y.o.-year-old patient lying in the bed with no acute distress.  EYES: Pupils equal, round, reactive to light and accommodation. No scleral icterus. Extraocular muscles intact.  HEENT: Head atraumatic, normocephalic. Oropharynx and nasopharynx clear.  NECK:  Supple, no jugular venous distention. No thyroid enlargement, no tenderness.  LUNGS:  Decreased bibasilar breath  sounds. Scant breath sounds bilaterally. No wheezing some rhonchi at the bases. no crackles. Use of accessory muscles for breathing. CARDIOVASCULAR: S1, S2 normal. Systolic murmur is present, no rubs or gallops. ABDOMEN: Soft, nontender, nondistended. Bowel sounds present. No organomegaly or mass.  EXTREMITIES: No pedal edema, cyanosis, or clubbing.  NEUROLOGIC: Cranial nerves II through XII are intact. Muscle strength 5/5 in all extremities. Sensation intact. Gait not checked.  PSYCHIATRIC: The patient is alert and oriented x 3.  SKIN: No obvious rash, lesion, or ulcer.    LABORATORY PANEL:   CBC  Recent Labs Lab 09/20/14 1618  WBC 14.8*  HGB 15.1  HCT 45.9  PLT 222   ------------------------------------------------------------------------------------------------------------------  Chemistries   Recent Labs Lab 09/20/14 1618 09/20/14 2000  NA 141  --   K 3.0*  --   CL 95*  --   CO2 32  --   GLUCOSE 184*  --   BUN 30*  --   CREATININE 0.91  --   CALCIUM 9.5  --   MG  --  1.7  AST 73*  --   ALT 48  --   ALKPHOS 120  --   BILITOT 0.6  --    ------------------------------------------------------------------------------------------------------------------  Cardiac Enzymes  Recent Labs Lab 09/20/14 1618  TROPONINI 0.03   ------------------------------------------------------------------------------------------------------------------  RADIOLOGY:  Dg Chest 1 View  09/20/2014   CLINICAL DATA:  Multiple aspiration episodes, hypoxia.  EXAM: CHEST  1 VIEW  COMPARISON:  CT scan of the chest of October 11, 2009  FINDINGS: There is dense infiltrate involving the left lower lobe. The left heart border remains faintly visible. The right lung is adequately  inflated without focal infiltrate. There is no pulmonary vascular congestion. There is no definite pleural effusion. There is acute angulation of the upper thoracic spine in a reverse S-shaped configuration which is stable.   IMPRESSION: Findings consistent with aspiration pneumonia in the left lower lobe. There is underlying COPD. A PA and lateral chest x-ray would be useful when the patient can tolerate the procedure.   Electronically Signed   By: David  [place] M.D.   On: 09/20/2014 16:53   Ct Head Wo Contrast  09/20/2014   CLINICAL DATA:  Increasing confusion and dysphagia ; aspirated earlier study  EXAM: CT HEAD WITHOUT CONTRAST  TECHNIQUE: Contiguous axial images were obtained from the base of the skull through the vertex without intravenous contrast.  COMPARISON:  Sep 04, 2013  FINDINGS: There is mild diffuse atrophy. There is no intracranial mass, hemorrhage, extra-axial fluid collection, or midline shift. There is mild small vessel disease in the centra semiovale bilaterally. There is no new gray-white compartment lesion. No acute infarct apparent. The bony calvarium appears intact. The mastoid air cells are clear.  IMPRESSION: Atrophy with mild periventricular small vessel disease. No intracranial mass, hemorrhage, or acute appearing infarct.   Electronically Signed   By: Bretta Bang III M.D.   On: 09/20/2014 16:36    EKG:  No orders found for this or any previous visit.  ASSESSMENT AND PLAN:   81 year old elderly female with past medical history significant for atrial fibrillation on Coumadin, dementia, and anemia presents from home secondary to possible aspiration.  #1 aspiration pneumonitis-presenting with acute respiratory distress, tachypnea and hypoxia. Was on 5 L oxygen, now been down to 2 L. Patient not on any home oxygen. So we'll try to wean her off O2 by tomorrow. Continue clindamycin. Add probiotic also. Speech therapy evaluation is appreciated. Patient can continue soft diet with pills crushed in applesauce.  #2 hypokalemia- being replaced. We will recheck tomorrow.  #3 atrial fibrillation-rate not very well controlled. Her blood pressure low so we will add a very low dose Coreg. On Coumadin  for anticoagulation. INR is supratherapeutic. So Coumadin is on hold. Pharmacy adjusting Coumadin dose.  #4 history of gastritis-continue Protonix.  Physical therapy evaluation, likely discharge back tomorrow.  All the records are reviewed and case discussed with Care Management/Social Workerr. Management plans discussed with the patient, family and they are in agreement.  CODE STATUS: Full Code  TOTAL TIME TAKING CARE OF THIS PATIENT: 40 minutes.   POSSIBLE D/C TOMORROW, DEPENDING ON CLINICAL CONDITION.   Enid Baas M.D on 09/21/2014 at 1:59 PM  Between 7am to 6pm - Pager - [phone]  After 6pm go to [email or web address] - password [email or web address] [facility]  [facility]  Office  [phone]  CC: Primary care physician; Ruthe Mannan, MD

## 2014-09-21 NOTE — Plan of Care (Signed)
Problem: Discharge Progression Outcomes Goal: Discharge plan in place and appropriate Individualization  Outcome: Progressing Individualization  Pt goes by Jamie Norton. [facility] is [facility]. She has a hx of Atrial fibrillation, Dementia, Gastritis, Anemia, Depression and is on home medications.  Goal: Other Discharge Outcomes/Goals Outcome: Progressing Pt alert and oriented, calls out for assistance when needed, weening off 02, 97% on 2L. Pt having hard stool so miralax was ordered daily. No complaints of pain or discomfort.

## 2014-09-22 ENCOUNTER — Inpatient Hospital Stay: Payer: Medicare Other

## 2014-09-22 LAB — BASIC METABOLIC PANEL
Anion gap: 5 (ref 5–15)
BUN: 27 mg/dL — AB (ref 6–20)
CO2: 27 mmol/L (ref 22–32)
Calcium: 8.5 mg/dL — ABNORMAL LOW (ref 8.9–10.3)
Chloride: 106 mmol/L (ref 101–111)
Creatinine, Ser: 0.72 mg/dL (ref 0.44–1.00)
GFR calc Af Amer: >60 mL/min (ref >60)
Glucose, Bld: 150 mg/dL — ABNORMAL HIGH (ref 65–99)
Potassium: 4.9 mmol/L (ref 3.5–5.1)
Sodium: 138 mmol/L (ref 135–145)

## 2014-09-22 LAB — MISC LABCORP TEST (SEND OUT)
LABCORP TEST CODE: 18788
Labcorp test code: 182246

## 2014-09-22 LAB — PROTIME-INR
INR: 2.66
PROTHROMBIN TIME: 28.4 s — AB (ref 11.4–15.0)

## 2014-09-22 MED ORDER — IPRATROPIUM-ALBUTEROL 0.5-2.5 (3) MG/3ML IN SOLN
3.0000 mL | Freq: Four times a day (QID) | RESPIRATORY_TRACT | Status: DC
  Administered 2014-09-22 – 2014-09-27 (×21): 3 mL via RESPIRATORY_TRACT
  Filled 2014-09-22 (×23): qty 3

## 2014-09-22 MED ORDER — WARFARIN SODIUM 2 MG PO TABS
3.0000 mg | ORAL_TABLET | ORAL | Status: DC
  Administered 2014-09-23 – 2014-09-24 (×2): 3 mg via ORAL
  Filled 2014-09-22 (×2): qty 2

## 2014-09-22 MED ORDER — IPRATROPIUM-ALBUTEROL 20-100 MCG/ACT IN AERS: 1.0000 | INHALATION_SPRAY | Freq: Four times a day (QID) | RESPIRATORY_TRACT | Status: DC

## 2014-09-22 MED ORDER — VANCOMYCIN HCL 500 MG IV SOLR
500.0000 mg | INTRAVENOUS | Status: DC
  Administered 2014-09-23 – 2014-09-24 (×2): 500 mg via INTRAVENOUS
  Filled 2014-09-22 (×4): qty 500

## 2014-09-22 MED ORDER — GUAIFENESIN ER 600 MG PO TB12: 600.0000 mg | ORAL_TABLET | Freq: Two times a day (BID) | ORAL | Status: AC

## 2014-09-22 MED ORDER — RISAQUAD PO CAPS: 1.0000 | ORAL_CAPSULE | Freq: Two times a day (BID) | ORAL | Status: DC

## 2014-09-22 MED ORDER — CARVEDILOL 3.125 MG PO TABS
3.1250 mg | ORAL_TABLET | Freq: Two times a day (BID) | ORAL | Status: AC
Start: 2014-09-22 — End: ?

## 2014-09-22 MED ORDER — GUAIFENESIN ER 600 MG PO TB12
600.0000 mg | ORAL_TABLET | Freq: Two times a day (BID) | ORAL | Status: AC
  Administered 2014-09-22 – 2014-09-24 (×6): 600 mg via ORAL
  Filled 2014-09-22 (×6): qty 1

## 2014-09-22 MED ORDER — CLINDAMYCIN PALMITATE HCL 75 MG/5ML PO SOLR: 300.0000 mg | Freq: Three times a day (TID) | ORAL | Status: DC

## 2014-09-22 MED ORDER — FUROSEMIDE 20 MG PO TABS
20.0000 mg | ORAL_TABLET | Freq: Every day | ORAL | Status: DC
  Administered 2014-09-22 – 2014-09-27 (×5): 20 mg via ORAL
  Filled 2014-09-22 (×5): qty 1

## 2014-09-22 MED ORDER — PIPERACILLIN-TAZOBACTAM 3.375 G IVPB: 3.3750 g | Freq: Three times a day (TID) | INTRAVENOUS | Status: DC

## 2014-09-22 MED ORDER — WARFARIN - PHARMACIST DOSING INPATIENT
Freq: Every day | Status: DC
  Administered 2014-09-27 (×2)
  Filled 2014-09-22: qty 1

## 2014-09-22 MED ORDER — MEROPENEM 1 G IV SOLR
1.0000 g | Freq: Two times a day (BID) | INTRAVENOUS | Status: DC
  Administered 2014-09-22 – 2014-09-27 (×10): 1 g via INTRAVENOUS
  Filled 2014-09-22 (×14): qty 1

## 2014-09-22 MED ORDER — SODIUM CHLORIDE 0.9 % IV SOLN
500.0000 mg | INTRAVENOUS | Status: DC
  Filled 2014-09-22: qty 500

## 2014-09-22 MED ORDER — VANCOMYCIN HCL IN DEXTROSE 750-5 MG/150ML-% IV SOLN
750.0000 mg | INTRAVENOUS | Status: AC
  Administered 2014-09-22: 750 mg via INTRAVENOUS
  Filled 2014-09-22: qty 150

## 2014-09-22 MED ORDER — WARFARIN SODIUM 2 MG PO TABS
2.0000 mg | ORAL_TABLET | ORAL | Status: DC
  Administered 2014-09-22 – 2014-09-25 (×2): 2 mg via ORAL
  Filled 2014-09-22 (×2): qty 1

## 2014-09-22 NOTE — Progress Notes (Signed)
ANTICOAGULATION CONSULT NOTE - Follow Up Consult  Pharmacy Consult for Coumadin Indication: atrial fibrillation  Allergies  Allergen Reactions  . Amoxicillin Itching  . Doxycycline Other (See Comments)    Reaction: Unknown  . Polytrim [Polymyxin B-Trimethoprim] Other (See Comments)    Reaction: Unknown  . Tolmetin Itching    Patient Measurements: Height:  (157.5 cm) Weight: 101 lb (45.813 kg) IBW/kg (Calculated) : 50.1 Heparin Dosing Weight: n/a  Vital Signs: Temp: 98.7 F (37.1 C) (05/27 0514) Temp Source: Oral (05/27 0514) BP: 133/63 mmHg (05/27 0514) Pulse Rate: 86 (05/27 0516)  Labs:  Recent Labs  09/20/14 1618 09/21/14 0448 09/22/14 0609  HGB 15.1  --   --   HCT 45.9  --   --   PLT 222  --   --   APTT 34  --   --   LABPROT 29.7* 37.4* 28.4*  INR 2.81 3.80 2.66  CREATININE 0.91  --  0.72  TROPONINI 0.03  --   --     Estimated Creatinine Clearance: 33.8 mL/min (by C-G formula based on Cr of 0.72).   Medications:  Prescriptions prior to admission  Medication Sig Dispense Refill Last Dose  . Calcium Citrate-Vitamin D (CITRACAL PETITES/VITAMIN D PO) Take 2 tablets by mouth 2 (two) times daily.   09/20/2014 at 0800  . Carboxymethylcellulose Sodium (REFRESH LIQUIGEL OP) Apply 1 application to eye daily.   09/19/2014 at 2200  . cholecalciferol (VITAMIN D) 1000 UNITS tablet Take 1,000 Units by mouth daily.   09/20/2014 at 0800  . donepezil (ARICEPT) 10 MG tablet Take 10 mg by mouth at bedtime.   09/19/2014 at 2200  . furosemide (LASIX) 20 MG tablet Take 20 mg by mouth 2 (two) times daily.   09/20/2014 at 0700  . gabapentin (NEURONTIN) 100 MG capsule Take 100 mg by mouth 3 (three) times daily as needed (for pain).   PRN at PRN  . Multiple Vitamins-Minerals (MULTIVITAMIN ADULTS 50+ PO) Take 1 tablet by mouth daily.   09/20/2014 at 0800  . omeprazole (PRILOSEC) 40 MG capsule Take 40 mg by mouth daily.   09/20/2014 at Unknown time  . Polyethyl Glycol-Propyl Glycol  0.4-0.3 % SOLN Apply 1 drop to eye 2 (two) times daily.   09/20/2014 at 0800  . sertraline (ZOLOFT) 50 MG tablet Take 50 mg by mouth at bedtime.   09/19/2014 at 2200  . [EXPIRED] sulfamethoxazole-trimethoprim (BACTRIM DS,SEPTRA DS) 800-160 MG per tablet Take 1 tablet by mouth 2 (two) times daily.   09/20/2014 at 0800  . vitamin C (ASCORBIC ACID) 500 MG tablet Take 500 mg by mouth daily.   09/20/2014 at 0800  . warfarin (COUMADIN) 2 MG tablet Take 2 mg by mouth daily. Pt takes  tablet on Monday-Wednesday-Friday. Pt takes  tablet on Tuesday-Thursday-Saturday-Sunday.   09/18/2014 at 1645  . warfarin (COUMADIN) 3 MG tablet Take 3 mg by mouth daily. Pt takes  tablet on Tuesday-Thursday-Saturday-Sunday. Pt takes  tablet on Monday-Wednesday-Friday.   09/19/2014 at 1645   Scheduled:  . acidophilus  1 capsule Oral BID  . carvedilol  3.125 mg Oral BID WC  . clindamycin  300 mg Oral 3 times per day  . donepezil  10 mg Oral QHS  . feeding supplement (ENSURE ENLIVE)  237 mL Oral BID BM  . pantoprazole  40 mg Oral Daily  . polyethylene glycol  17 g Oral Daily  . sertraline  50 mg Oral QHS  . warfarin  2 mg Oral Once per  day on Mon Wed Fri  . [START ON 09/23/2014] warfarin  3 mg Oral Once per day on Sun Tue Thu Sat  . Warfarin - Pharmacist Dosing Inpatient   Does not apply q1800    Assessment: Patient on Coumadin for a-fib PTA, home dose of 2mg  on MWF and 3mg  on TThSaSu. Today's INR is back down to therapeutic resulting @ 2.66.  Goal of Therapy:  INR 2-3 Monitor platelets by anticoagulation protocol: Yes   Plan:  Will initiate home regimen of warfarin 2 mg MWF and 3 mg Tue,Thur, Sat, Sun  Demetrius Charityeldrin D. Wane Mollett, PharmD 09/22/2014 8:41 AM

## 2014-09-22 NOTE — Progress Notes (Signed)
[facility] -  at [facility]   PATIENT NAME: Jamie Norton    MR#:  [id number]  DATE OF BIRTH:  1921-08-27  SUBJECTIVE:  CHIEF COMPLAINT:   Chief Complaint  Patient presents with  . other     aspirated while eating pudding at lunch   Doing well. Eating breakfast this morning. Speech therapy has evaluated the patient. Patient doesn't have any trouble swallowing food, but she has trouble swallowing pills. So pills crushed in applesauce is recommended. She was on 5 L oxygen yesterday, now weaned down to 2 L. Her breathing has improved.  REVIEW OF SYSTEMS:  ROS Constitutional: Negative for fever and chills.  Eyes: Negative for blurred vision.  Respiratory: Positive for cough and shortness of breath. Negative for wheezing.  Cardiovascular: Negative for chest pain and palpitations.  Gastrointestinal: Negative for nausea, vomiting, abdominal pain, diarrhea and constipation.  Genitourinary: Negative for dysuria.  Neurological: Negative for dizziness, seizures and headaches.   DRUG ALLERGIES:   Allergies  Allergen Reactions  . Amoxicillin Itching  . Doxycycline Other (See Comments)    Reaction: Unknown  . Polytrim [Polymyxin B-Trimethoprim] Other (See Comments)    Reaction: Unknown  . Tolmetin Itching    VITALS:  Blood pressure 133/63, pulse 86, temperature 98.7 F (37.1 C), temperature source Oral, resp. rate 18, height  (1.575 m), weight 45.813 kg (101 lb), SpO2 91 %.  PHYSICAL EXAMINATION:  Physical Exam  GENERAL:  81 y.o.-year-old patient lying in the bed with no acute distress.  EYES: Pupils equal, round, reactive to light and accommodation. No scleral icterus. Extraocular muscles intact.  HEENT: Head atraumatic, normocephalic. Oropharynx and nasopharynx clear.  NECK:  Supple, no jugular venous distention. No thyroid enlargement, no tenderness.  LUNGS:  Decreased bibasilar breath sounds. Scant breath sounds bilaterally. No wheezing  some rhonchi at the bases. no crackles. Use of accessory muscles for breathing. CARDIOVASCULAR: S1, S2 normal. Systolic murmur is present, no rubs or gallops. ABDOMEN: Soft, nontender, nondistended. Bowel sounds present. No organomegaly or mass.  EXTREMITIES: No pedal edema, cyanosis, or clubbing.  NEUROLOGIC: Cranial nerves II through XII are intact. Muscle strength 5/5 in all extremities. Sensation intact. Gait not checked.  PSYCHIATRIC: The patient is alert and oriented x 3.  SKIN: No obvious rash, lesion, or ulcer.    LABORATORY PANEL:   CBC  Recent Labs Lab 09/20/14 1618  WBC 14.8*  HGB 15.1  HCT 45.9  PLT 222   ------------------------------------------------------------------------------------------------------------------  Chemistries   Recent Labs Lab 09/20/14 1618 09/20/14 2000 09/22/14 0609  NA 141  --  138  K 3.0*  --  4.9  CL 95*  --  106  CO2 32  --  27  GLUCOSE 184*  --  150*  BUN 30*  --  27*  CREATININE 0.91  --  0.72  CALCIUM 9.5  --  8.5*  MG  --  1.7  --   AST 73*  --   --   ALT 48  --   --   ALKPHOS 120  --   --   BILITOT 0.6  --   --    ------------------------------------------------------------------------------------------------------------------  Cardiac Enzymes  Recent Labs Lab 09/20/14 1618  TROPONINI 0.03   ------------------------------------------------------------------------------------------------------------------  RADIOLOGY:  Dg Chest 1 View  09/20/2014   CLINICAL DATA:  Multiple aspiration episodes, hypoxia.  EXAM: CHEST  1 VIEW  COMPARISON:  CT scan of the chest of October 11, 2009  FINDINGS: There is dense infiltrate involving  the left lower lobe. The left heart border remains faintly visible. The right lung is adequately inflated without focal infiltrate. There is no pulmonary vascular congestion. There is no definite pleural effusion. There is acute angulation of the upper thoracic spine in a reverse S-shaped configuration  which is stable.  IMPRESSION: Findings consistent with aspiration pneumonia in the left lower lobe. There is underlying COPD. A PA and lateral chest x-ray would be useful when the patient can tolerate the procedure.   Electronically Signed   By: David  SwazilandJordan M.D.   On: 09/20/2014 16:53   Ct Head Wo Contrast  09/20/2014   CLINICAL DATA:  Increasing confusion and dysphagia ; aspirated earlier study  EXAM: CT HEAD WITHOUT CONTRAST  TECHNIQUE: Contiguous axial images were obtained from the base of the skull through the vertex without intravenous contrast.  COMPARISON:  Sep 04, 2013  FINDINGS: There is mild diffuse atrophy. There is no intracranial mass, hemorrhage, extra-axial fluid collection, or midline shift. There is mild small vessel disease in the centra semiovale bilaterally. There is no new gray-white compartment lesion. No acute infarct apparent. The bony calvarium appears intact. The mastoid air cells are clear.  IMPRESSION: Atrophy with mild periventricular small vessel disease. No intracranial mass, hemorrhage, or acute appearing infarct.   Electronically Signed   By: Bretta BangWilliam  Woodruff III M.D.   On: 09/20/2014 16:36    EKG:  No orders found for this or any previous visit.  ASSESSMENT AND PLAN:   81 year old elderly female with past medical history significant for atrial fibrillation on Coumadin, dementia, and anemia presents from home secondary to possible aspiration.  #1 aspiration pneumonitis-presenting with acute respiratory distress, tachypnea and hypoxia.  Remains on 3 L oxygen this morning. Complains of some dyspnea and coughing. Added Mucinex. Added Combivent for her COPD changes on chest x-ray. Continue clindamycin for her aspiration pneumonia. Initial chest x-ray with left lower lobe infiltrate. Follow-up chest x-ray. Continue to try to wean oxygen today.Marland Kitchen. Speech therapy evaluation is appreciated. Patient can continue soft diet with pills crushed in applesauce.  #2 hypokalemia-  replaced and resolved now  #3 atrial fibrillation-rate better controlled after adding Coreg. On Coumadin for anticoagulation. INR is therapeutic today.Marland Kitchen. Pharmacy adjusting Coumadin dose. She is restarted on Coumadin again today.  #4 history of gastritis-continue Protonix.  Physical therapy evaluation, likely discharge back tomorrow [facility].  All the records are reviewed and case discussed with Care Management/Social Workerr. Management plans discussed with the patient, family and they are in agreement.  CODE STATUS: Full Code  TOTAL TIME TAKING CARE OF THIS PATIENT: 40 minutes.   POSSIBLE D/C TOMORROW, DEPENDING ON CLINICAL CONDITION.   Enid BaasKALISETTI,Percy Comp M.D on 09/22/2014 at 10:17 AM  Between 7am to 6pm - Pager - [phone]  After 6pm go to [email or web address] - password [email or web address] [facility]  [facility]  Office  [phone]  CC: Primary care physician; Ruthe Mannanalia Aron, MD

## 2014-09-22 NOTE — Progress Notes (Signed)
Physical Therapy Evaluation Patient Details Name: Jamie OrleansLetitia R Norton MRN: [id number] DOB: [birth year] Today's Date: 09/22/2014   History of Present Illness  Pt is admitted for pneumonia. Pt with history of Afib, dementia, and anemia.   Clinical Impression  Pt is a pleasant 81 year old female who was admitted for pnemonia. Pt performs bed mobility with independence, transfers with MI, and ambulation with supervision using rw. Pt demonstrates deficits with endurance at this time. Pt on 2L of O2 upon entering room with sats at 84%. Pt required increase in O2 to 4L of O2 during ambulation with sats decreasing to 82% with increased endurance. Further ambulation deferred. Would benefit from skilled PT to address above deficits and promote optimal return to PLOF. Recommend discharge back to ALF with HHPT upon discharge from acute hospitalization.      Follow Up Recommendations Home health PT (return back to ALF)    Equipment Recommendations   (O2)    Recommendations for Other Services       Precautions / Restrictions Precautions Precautions: None Restrictions Weight Bearing Restrictions: No      Mobility  Bed Mobility Overal bed mobility: Independent             General bed mobility comments: supine->sit with safe technique and independence.  Transfers Overall transfer level: Needs assistance Equipment used: Rolling walker (2 wheeled) Transfers: Sit to/from Stand Sit to Stand: Modified independent (Device/Increase time)         General transfer comment: sit<>Stand with safe technique with use of rw.   Ambulation/Gait Ambulation/Gait assistance: Supervision Ambulation Distance (Feet): 40 Feet Assistive device: Rolling walker (2 wheeled) Gait Pattern/deviations: Step-through pattern     General Gait Details: ambulated using rw and supervision. Pt ambulated using reciprocal gait pattern with O2 (4L) donned. O2 sats at 82% post ambulation. RN aware. Further gait distance  deferred secondary to low O2 sats.  Stairs            Wheelchair Mobility    Modified Rankin (Stroke Patients Only)       Balance Overall balance assessment: Needs assistance   Sitting balance-Leahy Scale: Normal       Standing balance-Leahy Scale: Fair                               Pertinent Vitals/Pain Pain Assessment: No/denies pain    Home Living Family/patient expects to be discharged to:: Assisted living               Home Equipment: Walker - 2 wheels      Prior Function Level of Independence: Independent with assistive device(s)               Hand Dominance        Extremity/Trunk Assessment   Upper Extremity Assessment: Generalized weakness (B UE grossly 3/5)           Lower Extremity Assessment: Overall WFL for tasks assessed         Communication   Communication: No difficulties  Cognition Arousal/Alertness: Awake/alert Behavior During Therapy: WFL for tasks assessed/performed Overall Cognitive Status: Within Functional Limits for tasks assessed                      General Comments      Exercises        Assessment/Plan    PT Assessment Patient needs continued PT services  PT Diagnosis Difficulty walking  PT Problem List Decreased activity tolerance  PT Treatment Interventions Gait training   PT Goals (Current goals can be found in the Care Plan section) Acute Rehab PT Goals Patient Stated Goal: to go back to [facility] PT Goal Formulation: With patient Time For Goal Achievement: 10/06/14 Potential to Achieve Goals: Good    Frequency Min 2X/week   Barriers to discharge        Co-evaluation               End of Session Equipment Utilized During Treatment: Oxygen Activity Tolerance: Patient tolerated treatment well Patient left: in chair;with chair alarm set Nurse Communication: Mobility status         Time: 0454-0981 PT Time Calculation (min) (ACUTE ONLY): 19  min   Charges:   PT Evaluation $Initial PT Evaluation Tier I: 1 Procedure     PT G CodesElizabeth Norton, PT, DPT [phone]  Jamie Norton 09/22/2014, 11:38 AM

## 2014-09-22 NOTE — Progress Notes (Signed)
Initial Nutrition Assessment  DOCUMENTATION CODES:     INTERVENTION: Meals and Snacks: Cater to patient preferences within dietary restrictions per SLP Medical Food Supplement Therapy: recommend continuing Ensure Enlive (each supplement provides 350kcal and 20 grams of protein)  BID as pt drinking NUTRITION DIAGNOSIS:  Swallowing difficulty related to dysphagia as evidenced by  (aspiration pna, SLP following, current need for dysphagia III diet order).  GOAL:  Patient will meet greater than or equal to 90% of their needs  MONITOR:   (Energy Intake, Digestive system, Anthropometrics)  REASON FOR ASSESSMENT:   (RD Screen, Diagnosis)    ASSESSMENT:  Pt admitted with aspiration pneumonia; SLP following PMHx:  Past Medical History  Diagnosis Date  . Atrial fibrillation   . Dementia   . Gastritis   . Anemia   . Depression     PO Intake: pt reports eating 'a little bit' of lunch today, reports not much of an appetite. Pt reports eating small portions of meals PTA. Pt reports drinking atleast one Ensure daily PTA. Pt reports drinking 100% Ensure this am.  Medications: Lasix, Protonix, Coumadin Labs: Electrolyte and Renal Profile:    Recent Labs Lab 09/20/14 1618 09/20/14 2000 09/22/14 0609  BUN 30*  --  27*  CREATININE 0.91  --  0.72  NA 141  --  138  K 3.0*  --  4.9  MG  --  1.7  --    Glucose Profile: No results for input(s): GLUCAP in the last 72 hours. Protein Profile:  Recent Labs Lab 09/20/14 1618  ALBUMIN 4.0    Pt reports UBW of 98lbs. Current measured weight of 101lbs.  Height:  Ht Readings from Last 1 Encounters:  09/20/14 5\' 2"  (1.575 m)    Weight:  Wt Readings from Last 1 Encounters:  09/20/14 101 lb (45.813 kg)    Ideal Body Weight:     Wt Readings from Last 10 Encounters:  09/20/14 101 lb (45.813 kg)    BMI:  Body mass index is 18.47 kg/(m^2).  Nutrition-Focused physical exam completed. Findings are no fat depletion, severe  clavicle and mild/moderate shoulder and scapular muscle depletion, and no edema.    Estimated Nutritional Needs:  Kcal:  1152-1362kcals, BEE: 873kcals, TEE: (IF 1.1-1.3)(AF 1.2) using IBW of 50kg  Protein:  50-60g protein (1.0-1.2g/kg) using IBW of 50kg  Fluid:  1250-157300mL of fluid (25-1630mL/kg) using IBW of 50kg  Skin:  Reviewed, no issues  Diet Order:  DIET DYS 3 Room service appropriate?: Yes with Assist; Fluid consistency:: Thin DIET DYS 3 Diet - low sodium heart healthy  EDUCATION NEEDS:  No education needs identified at this time   Intake/Output Summary (Last 24 hours) at 09/22/14 1519 Last data filed at 09/22/14 1418  Gross per 24 hour  Intake   1583 ml  Output    650 ml  Net    933 ml    Last BM:  5/27 medium loose BM  MODERATE Care Level  Leda QuailAllyson Rashell Shambaugh, RD, LDN Pager [phone]

## 2014-09-22 NOTE — Progress Notes (Signed)
ANTIBIOTIC CONSULT NOTE - INITIAL  Pharmacy Consult for  Meropenem Indication: pneumonia  Allergies  Allergen Reactions  . Amoxicillin Itching  . Doxycycline Other (See Comments)    Reaction: Unknown  . Polytrim [Polymyxin B-Trimethoprim] Other (See Comments)    Reaction: Unknown  . Tolmetin Itching    Patient Measurements: Height:  (157.5 cm) Weight: 101 lb (45.813 kg) IBW/kg (Calculated) : 50.1 Adjusted Body Weight:   Vital Signs: Temp: 98.7 F (37.1 C) (05/27 0514) Temp Source: Oral (05/27 0514) BP: 133/63 mmHg (05/27 0514) Pulse Rate: 86 (05/27 0516) Intake/Output from previous day: 05/26 0701 - 05/27 0700 In: 1343 [P.O.:240; I.V.:1103] Out: 500 [Urine:500] Intake/Output from this shift: Total I/O In: 480 [P.O.:480] Out: 250 [Urine:250]  Labs:  Recent Labs  09/20/14 1618 09/22/14 0609  WBC 14.8*  --   HGB 15.1  --   PLT 222  --   CREATININE 0.91 0.72   Estimated Creatinine Clearance: 33.8 mL/min (by C-G formula based on Cr of 0.72). No results for input(s): VANCOTROUGH, VANCOPEAK, VANCORANDOM, GENTTROUGH, GENTPEAK, GENTRANDOM, TOBRATROUGH, TOBRAPEAK, TOBRARND, AMIKACINPEAK, AMIKACINTROU, AMIKACIN in the last 72 hours.   Microbiology: Recent Results (from the past 720 hour(s))  Blood culture (routine x 2)     Status: None (Preliminary result)   Collection Time: 09/20/14  4:20 PM  Result Value Ref Range Status   Specimen Description BLOOD  Final   Special Requests NONE  Final   Culture NO GROWTH 2 DAYS  Final   Report Status PENDING  Incomplete  Blood culture (routine x 2)     Status: None (Preliminary result)   Collection Time: 09/20/14  8:00 PM  Result Value Ref Range Status   Specimen Description BLOOD  Final   Special Requests NONE  Final   Culture NO GROWTH 2 DAYS  Final   Report Status PENDING  Incomplete  MRSA PCR Screening     Status: None   Collection Time: 09/21/14  3:45 AM  Result Value Ref Range Status   MRSA by PCR NEGATIVE  NEGATIVE Final    Comment:        The GeneXpert MRSA Assay (FDA approved for NASAL specimens only), is one component of a comprehensive MRSA colonization surveillance program. It is not intended to diagnose MRSA infection nor to guide or monitor treatment for MRSA infections.   Urine culture     Status: None (Preliminary result)   Collection Time: 09/21/14 11:02 AM  Result Value Ref Range Status   Specimen Description URINE, RANDOM  Final   Special Requests NONE  Final   Culture NO GROWTH < 24 HOURS  Final   Report Status PENDING  Incomplete    Medical History: Past Medical History  Diagnosis Date  . Atrial fibrillation   . Dementia   . Gastritis   . Anemia   . Depression     Medications:  Scheduled:  . acidophilus  1 capsule Oral BID  . carvedilol  3.125 mg Oral BID WC  . donepezil  10 mg Oral QHS  . feeding supplement (ENSURE ENLIVE)  237 mL Oral BID BM  . furosemide  20 mg Oral Daily  . guaiFENesin  600 mg Oral BID  . ipratropium-albuterol  3 mL Nebulization QID  . meropenem (MERREM) IV  1 g Intravenous Q12H  . pantoprazole  40 mg Oral Daily  . polyethylene glycol  17 g Oral Daily  . sertraline  50 mg Oral QHS  . warfarin  2 mg Oral Once  per day on Mon Wed Fri  . [START ON 09/23/2014] warfarin  3 mg Oral Once per day on Sun Tue Thu Sat  . Warfarin - Pharmacist Dosing Inpatient   Does not apply q1800   Assessment: Pt from nursing home with aspiration PNA,  Impaired renal function, allergic to amoxicillin (itching)  Goal of Therapy:    Plan:  Will order Meropenem 1 gm IV Q12H   Antonyo Hinderer D 09/22/2014,3:32 PM

## 2014-09-22 NOTE — Care Management (Signed)
Admitted to [facility] with the diagnosis of aspiration pneumonia. Lives at [facility] since 04/26/12. Sees Dr. Sampson GoonFitzgerald. Son is Dorinda HillDonald [phone]).  Physical therapy evaluation in progress. Therapist indicated that Jamie Norton's saturations decreased to the lower 80's while ambulating. Oxygen 2 liters per nasal cannula to maintain saturations of lower 90's.  May need home oxygen arranged prior to discharge, Gwenette GreetBrenda S Holland RN MSN Care Management [phone]

## 2014-09-22 NOTE — Clinical Social Work Note (Signed)
Clinical Social Work Assessment  Patient Details  Name: Jamie Norton MRN: [id number] Date of Birth: Aug 15, 1921  Date of referral:  09/22/14               Reason for consult:  Facility Placement                Permission sought to share information with:  Facility Sport and exercise psychologist, Family Supports Permission granted to share information::  Yes, Verbal Permission Granted  Name::      Pt's son, Elenore Rota   Housing/Transportation Living arrangements for the past 2 months:  [place] of Information:  Patient, Adult Children Patient Interpreter Needed:  None Criminal Activity/Legal Involvement Pertinent to Current Situation/Hospitalization:  No - Comment as needed Significant Relationships:  Adult Children Lives with:  Facility Resident Do you feel safe going back to the place where you live?  Yes Need for family participation in patient care:  Yes (Comment)  Care giving concerns: Pt needs a higher level of care.    Social Worker assessment / plan: CSW met with pt to address consult. CSW introduced herself and explained of social work. CSW also explained process of discharging to SNF. Pt is from [facility], however she needs a high level of care prior to returning. Pt is agreeable. Pt is agreeable to [facility].   Fl2 is on the chart for MD's signature. CSW will continue to follow.   Employment status:  Retired Forensic scientist:  Medicare PT Recommendations:  Home with [place] (Pt needs additional supoprt at healthcare. ) Information / Referral to community resources:  [place]  Patient/Family's Response to care: Pt's son is agreeable to discharge plan.   Patient/Family's Understanding of and Emotional Response to Diagnosis, Current Treatment, and Prognosis: Pt's son understands that level of care change, short term.   Emotional Assessment Appearance:  Appears stated age Attitude/Demeanor/Rapport:  Other  (appropriate) Affect (typically observed):  Accepting Orientation:  Oriented to Self, Fluctuating Orientation (Suspected and/or reported Sundowners) Alcohol / Substance use:  Never Used Psych involvement (Current and /or in the community):  No (Comment)  Discharge Needs  Concerns to be addressed:  Other (Comment Required (higher level of care. ) Readmission within the last 30 days:  No Current discharge risk:  Chronically ill Barriers to Discharge:  Barriers Resolved   Darden Dates, LCSW 09/22/2014, 3:49 PM

## 2014-09-22 NOTE — Progress Notes (Signed)
ANTIBIOTIC CONSULT NOTE - INITIAL  Pharmacy Consult for Vancomycin Indication: pneumonia  Allergies  Allergen Reactions  . Amoxicillin Itching  . Doxycycline Other (See Comments)    Reaction: Unknown  . Polytrim [Polymyxin B-Trimethoprim] Other (See Comments)    Reaction: Unknown  . Tolmetin Itching    Patient Measurements: Height: 5\' 2"  (157.5 cm) Weight: 101 lb (45.813 kg) IBW/kg (Calculated) : 50.1 Adjusted Body Weight:  45.8 kg  Vital Signs: Temp: 98.7 F (37.1 C) (05/27 0514) Temp Source: Oral (05/27 0514) BP: 133/63 mmHg (05/27 0514) Pulse Rate: 86 (05/27 0516) Intake/Output from previous day: 05/26 0701 - 05/27 0700 In: 1343 [P.O.:240; I.V.:1103] Out: 500 [Urine:500] Intake/Output from this shift: Total I/O In: 480 [P.O.:480] Out: 250 [Urine:250]  Labs:  Recent Labs  09/20/14 1618 09/22/14 0609  WBC 14.8*  --   HGB 15.1  --   PLT 222  --   CREATININE 0.91 0.72   Estimated Creatinine Clearance: 33.8 mL/min (by C-G formula based on Cr of 0.72). No results for input(s): VANCOTROUGH, VANCOPEAK, VANCORANDOM, GENTTROUGH, GENTPEAK, GENTRANDOM, TOBRATROUGH, TOBRAPEAK, TOBRARND, AMIKACINPEAK, AMIKACINTROU, AMIKACIN in the last 72 hours.   Microbiology: Recent Results (from the past 720 hour(s))  Blood culture (routine x 2)     Status: None (Preliminary result)   Collection Time: 09/20/14  4:20 PM  Result Value Ref Range Status   Specimen Description BLOOD  Final   Special Requests NONE  Final   Culture NO GROWTH 2 DAYS  Final   Report Status PENDING  Incomplete  Blood culture (routine x 2)     Status: None (Preliminary result)   Collection Time: 09/20/14  8:00 PM  Result Value Ref Range Status   Specimen Description BLOOD  Final   Special Requests NONE  Final   Culture NO GROWTH 2 DAYS  Final   Report Status PENDING  Incomplete  MRSA PCR Screening     Status: None   Collection Time: 09/21/14  3:45 AM  Result Value Ref Range Status   MRSA by PCR  NEGATIVE NEGATIVE Final    Comment:        The GeneXpert MRSA Assay (FDA approved for NASAL specimens only), is one component of a comprehensive MRSA colonization surveillance program. It is not intended to diagnose MRSA infection nor to guide or monitor treatment for MRSA infections.   Urine culture     Status: None (Preliminary result)   Collection Time: 09/21/14 11:02 AM  Result Value Ref Range Status   Specimen Description URINE, RANDOM  Final   Special Requests NONE  Final   Culture NO GROWTH < 24 HOURS  Final   Report Status PENDING  Incomplete    Medical History: Past Medical History  Diagnosis Date  . Atrial fibrillation   . Dementia   . Gastritis   . Anemia   . Depression     Medications:  Scheduled:  . acidophilus  1 capsule Oral BID  . carvedilol  3.125 mg Oral BID WC  . donepezil  10 mg Oral QHS  . feeding supplement (ENSURE ENLIVE)  237 mL Oral BID BM  . furosemide  20 mg Oral Daily  . guaiFENesin  600 mg Oral BID  . ipratropium-albuterol  3 mL Nebulization QID  . meropenem (MERREM) IV  1 g Intravenous Q12H  . pantoprazole  40 mg Oral Daily  . polyethylene glycol  17 g Oral Daily  . sertraline  50 mg Oral QHS  . vancomycin  750 mg Intravenous  STAT  . warfarin  2 mg Oral Once per day on Mon Wed Fri  . [START ON 09/23/2014] warfarin  3 mg Oral Once per day on Sun Tue Thu Sat  . Warfarin - Pharmacist Dosing Inpatient   Does not apply q1800   Assessment: CrCl = 33.8 ml/min Ke  = 0.03 hr-1 T1/2 = 23.1 hrs Vd = 32 L  Goal of Therapy:  Vancomycin trough level 15-20 mcg/ml  Plan:  Vancomycin 750 mg IV X 1 ordered STAT for 5/27. Vancomycin 500 mg IV Q24H ordered to start on 5/28 @  12:00,  16 hrs after 1st dose (stacked dosing). This pt will reach Css by 6/1 @ 20:00. Will draw 1st trough on 5/31 @ 11:30, which will not be at Css.   Tashanti Dalporto D 09/22/2014,4:11 PM

## 2014-09-22 NOTE — Discharge Summary (Addendum)
[facility] - [place] at [facility]   PATIENT NAME: Jamie Norton    MR#:  [id number]  DATE OF BIRTH:  April 25, 1922  DATE OF ADMISSION:  09/20/2014 ADMITTING PHYSICIAN: Shaune Pollack, MD  DATE OF DISCHARGE: 09/27/2014  PRIMARY CARE PHYSICIAN: Ruthe Mannan, MD    ADMISSION DIAGNOSIS:  Hypoxia [R09.02] Aspiration pneumonia, unspecified aspiration pneumonia type [J69.0]  DISCHARGE DIAGNOSIS:  Active Problems:   Aspiration pneumonia   SECONDARY DIAGNOSIS:   Past Medical History  Diagnosis Date  . Atrial fibrillation   . Dementia   . Gastritis   . Anemia   . Depression     HOSPITAL COURSE:   81 year old elderly female with past medical history significant for atrial fibrillation on Coumadin, dementia, and anemia presents from home secondary to possible aspiration.  #1 acute hypoxic respiratory failure-secondary to aspiration pneumonia-presenting with acute respiratory distress, tachypnea and hypoxia.  - O2 requirements are improving- now at 3l. Chronic aspiration- likely will need o2 for long term - wean as tolerated - Encourage flutter valve.  - repeat CXR with same findings. on meropenem.- change to Levaquin at discharge - Continue Combivent for her COPD changes on chest x-ray.  - Speech therapy evaluation is appreciated.  - Low-dose Lasix has been added. Also do a nebs when necessary.  #2 Dysphagia- appreciate speech therapist consult. Pt had modified barium swallow study done and did not do so well. Discussed about PEG tube vs strategies/techniques to son and patient-refused PEG at this time. - until then diet per speech recommendations - likely has been having chronic aspiration - Appreciate palliative care consult  #3 atrial fibrillation-rate better controlled after adding Coreg.  - On Coumadin for anticoagulation. INR is supra-therapeutic now. - restart at a lower dose from tomorrow- also due to interaction with levaquin- needs a close  monitoring of INR  #4 history of gastritis-continue Protonix.  DISCHARGE CONDITIONS:  Guarded  CONSULTS OBTAINED:    Palliative Care consultation by Dr. Ned Grace, M.D  DRUG ALLERGIES:   Allergies  Allergen Reactions  . Amoxicillin Itching  . Doxycycline Other (See Comments)    Reaction: Unknown  . Polytrim [Polymyxin B-Trimethoprim] Other (See Comments)    Reaction: Unknown  . Tolmetin Itching    DISCHARGE MEDICATIONS:   Current Discharge Medication List    START taking these medications   Details  carvedilol (COREG) 3.125 MG tablet Take 1 tablet (3.125 mg total) by mouth 2 (two) times daily with a meal. Qty: 60 tablet, Refills: 0    guaiFENesin (MUCINEX) 600 MG 12 hr tablet Take 1 tablet (600 mg total) by mouth 2 (two) times daily. Qty: 10 tablet, Refills: 0    Ipratropium-Albuterol (COMBIVENT RESPIMAT) 20-100 MCG/ACT AERS respimat Inhale 1 puff into the lungs every 6 (six) hours. Qty: 1 Inhaler, Refills: 1    ipratropium-albuterol (DUONEB) 0.5-2.5 (3) MG/3ML SOLN Take 3 mLs by nebulization every 6 (six) hours as needed (wheezing). Qty: 360 mL, Refills: 0    levofloxacin (LEVAQUIN) 500 MG tablet Take 1 tablet (500 mg total) by mouth daily. Qty: 5 tablet, Refills: 0      CONTINUE these medications which have CHANGED   Details  furosemide (LASIX) 20 MG tablet Take 1 tablet (20 mg total) by mouth daily. Qty: 30 tablet, Refills: 0    warfarin (COUMADIN) 2 MG tablet Take 1 tablet (2 mg total) by mouth daily. Qty: 30 tablet, Refills: 2      CONTINUE these medications which have NOT CHANGED  Details  Calcium Citrate-Vitamin D (CITRACAL PETITES/VITAMIN D PO) Take 2 tablets by mouth 2 (two) times daily.    Carboxymethylcellulose Sodium (REFRESH LIQUIGEL OP) Apply 1 application to eye daily.    cholecalciferol (VITAMIN D) 1000 UNITS tablet Take 1,000 Units by mouth daily.    donepezil (ARICEPT) 10 MG tablet Take 10 mg by mouth at bedtime.    gabapentin  (NEURONTIN) 100 MG capsule Take 100 mg by mouth 3 (three) times daily as needed (for pain).    Multiple Vitamins-Minerals (MULTIVITAMIN ADULTS 50+ PO) Take 1 tablet by mouth daily.    omeprazole (PRILOSEC) 40 MG capsule Take 40 mg by mouth daily.    Polyethyl Glycol-Propyl Glycol 0.4-0.3 % SOLN Apply 1 drop to eye 2 (two) times daily.    sertraline (ZOLOFT) 50 MG tablet Take 50 mg by mouth at bedtime.    vitamin C (ASCORBIC ACID) 500 MG tablet Take 500 mg by mouth daily.      STOP taking these medications     sulfamethoxazole-trimethoprim (BACTRIM DS,SEPTRA DS) 800-160 MG per tablet          DISCHARGE INSTRUCTIONS:   1. PCP f/u in 1-2 weeks 2. On Home o2 now 3. Physical Therapy  If you experience worsening of your admission symptoms, develop shortness of breath, life threatening emergency, suicidal or homicidal thoughts you must seek medical attention immediately by calling 911 or calling your MD immediately  if symptoms less severe.  You Must read complete instructions/literature along with all the possible adverse reactions/side effects for all the Medicines you take and that have been prescribed to you. Take any new Medicines after you have completely understood and accept all the possible adverse reactions/side effects.   Please note  You were cared for by a hospitalist during your hospital stay. If you have any questions about your discharge medications or the care you received while you were in the hospital after you are discharged, you can call the unit and asked to speak with the hospitalist on call if the hospitalist that took care of you is not available. Once you are discharged, your primary care physician will handle any further medical issues. Please note that NO REFILLS for any discharge medications will be authorized once you are discharged, as it is imperative that you return to your primary care physician (or establish a relationship with a primary care physician if  you do not have one) for your aftercare needs so that they can reassess your need for medications and monitor your lab values.    Today   CHIEF COMPLAINT:   Chief Complaint  Patient presents with  . other     aspirated while eating pudding at lunch     VITAL SIGNS:  Blood pressure 122/66, pulse 88, temperature 98 F (36.7 C), temperature source Oral, resp. rate 18, height  (1.575 m), weight 45.813 kg (101 lb), SpO2 90 %.  I/O:    Intake/Output Summary (Last 24 hours) at 09/27/14 1359 Last data filed at 09/27/14 0750  Gross per 24 hour  Intake    120 ml  Output      0 ml  Net    120 ml    PHYSICAL EXAMINATION:   Physical Exam  GENERAL: 81 y.o.-year-old patient lying in the bed with no acute distress.  EYES: Pupils equal, round, reactive to light and accommodation. No scleral icterus. Extraocular muscles intact.  HEENT: Head atraumatic, normocephalic. Oropharynx and nasopharynx clear.  NECK: Supple, no jugular venous distention.  No thyroid enlargement, no tenderness.  LUNGS: Decreased bibasilar breath sounds. Diffuse crackles and rhonchi all over the lungs. Scant breath sounds bilaterally. No wheezing . Use of accessory muscles for breathing. CARDIOVASCULAR: S1, S2 normal. Systolic murmur is present, no rubs or gallops.  ABDOMEN: Soft, nontender, nondistended. Bowel sounds present. No organomegaly or mass.  EXTREMITIES: No pedal edema, cyanosis, or clubbing.  NEUROLOGIC: Cranial nerves II through XII are intact. Muscle strength 5/5 in all extremities. Sensation intact. Gait not checked.  PSYCHIATRIC: The patient is alert and oriented x 3.  SKIN: No obvious rash, lesion, or ulcer.   DATA REVIEW:   CBC  Recent Labs Lab 09/24/14 0428 09/27/14 0605  WBC 13.3*  --   HGB 12.4 12.0  HCT 36.7  --   PLT 164  --     Chemistries   Recent Labs Lab 09/20/14 1618 09/20/14 2000  09/27/14 0605  NA 141  --   < > 138  K 3.0*  --   < > 4.1  CL 95*  --    < > 102  CO2 32  --   < > 31  GLUCOSE 184*  --   < > 93  BUN 30*  --   < > 27*  CREATININE 0.91  --   < > 0.49  CALCIUM 9.5  --   < > 8.1*  MG  --  1.7  --   --   AST 73*  --   --   --   ALT 48  --   --   --   ALKPHOS 120  --   --   --   BILITOT 0.6  --   --   --   < > = values in this interval not displayed.  Cardiac Enzymes  Recent Labs Lab 09/20/14 1618  TROPONINI 0.03    Microbiology Results  Results for orders placed or performed during the hospital encounter of 09/20/14  Blood culture (routine x 2)     Status: None   Collection Time: 09/20/14  4:20 PM  Result Value Ref Range Status   Specimen Description BLOOD  Final   Special Requests NONE  Final   Culture NO GROWTH 5 DAYS  Final   Report Status 09/25/2014 FINAL  Final  Blood culture (routine x 2)     Status: None   Collection Time: 09/20/14  8:00 PM  Result Value Ref Range Status   Specimen Description BLOOD  Final   Special Requests NONE  Final   Culture NO GROWTH 5 DAYS  Final   Report Status 09/25/2014 FINAL  Final  MRSA PCR Screening     Status: None   Collection Time: 09/21/14  3:45 AM  Result Value Ref Range Status   MRSA by PCR NEGATIVE NEGATIVE Final    Comment:        The GeneXpert MRSA Assay (FDA approved for NASAL specimens only), is one component of a comprehensive MRSA colonization surveillance program. It is not intended to diagnose MRSA infection nor to guide or monitor treatment for MRSA infections.   Urine culture     Status: None   Collection Time: 09/21/14 11:02 AM  Result Value Ref Range Status   Specimen Description URINE, RANDOM  Final   Special Requests NONE  Final   Culture NO GROWTH 2 DAYS  Final   Report Status 09/23/2014 FINAL  Final  C difficile quick scan w PCR reflex [facility])     Status: None  Collection Time: 09/23/14  7:33 AM  Result Value Ref Range Status   C Diff antigen NEGATIVE  Final   C Diff toxin NEGATIVE  Final   C Diff interpretation Negative for C.  difficile  Final    RADIOLOGY:  Dg Chest 2 View  09/26/2014   CLINICAL DATA:  Cough, congestion, congestion  EXAM: CHEST  2 VIEW  COMPARISON:  09/23/2014  FINDINGS: Cardiomediastinal silhouette is stable. Persistent airspace opacification of left lung and probable small left pleural effusion. Patchy airspace opacification in right lower lobe. Findings highly suspicious for bilateral pneumonia or ARDS. Atherosclerotic calcifications of thoracic aorta.  IMPRESSION: Persistent airspace opacification of left lung and probable small left pleural effusion. Patchy airspace opacification in right lower lobe. Findings highly suspicious for bilateral pneumonia or ARDS.   Electronically Signed   By: Natasha MeadLiviu  Pop M.D.   On: 09/26/2014 08:11    EKG:  No orders found for this or any previous visit.    Management plans discussed with the patient, family and they are in agreement.  CODE STATUS:     Code Status Orders        Start     Ordered   09/20/14 1941  DNR  Continuous     09/20/14 1940      TOTAL TIME TAKING CARE OF THIS PATIENT: 45 minutes.    Enid BaasKALISETTI,Ia Leeb M.D on 09/27/2014 at 2:57 PM  Between 7am to 6pm - Pager - [phone]  After 6pm go to [email or web address] - password [email or web address]  [facility]  Office  [phone]  CC: Primary care physician; Ruthe Mannanalia Aron, MD

## 2014-09-22 NOTE — Plan of Care (Signed)
Problem: Discharge Progression Outcomes Goal: Other Discharge Outcomes/Goals Plan of Care Progress To Goal: Pt alert and oriented, calls out for assistance when needed, weening off 02, 97% on 2L.Marland Kitchen. No complaints of pain or discomfort.

## 2014-09-23 ENCOUNTER — Inpatient Hospital Stay: Payer: Medicare Other

## 2014-09-23 LAB — URINE CULTURE: Culture: NO GROWTH

## 2014-09-23 LAB — C DIFFICILE QUICK SCREEN W PCR REFLEX
C Diff antigen: NEGATIVE
C Diff interpretation: NEGATIVE
C Diff toxin: NEGATIVE

## 2014-09-23 LAB — PROTIME-INR
INR: 2.1
Prothrombin Time: 23.7 seconds — ABNORMAL HIGH (ref 11.4–15.0)

## 2014-09-23 NOTE — Progress Notes (Signed)
[facility] - [place] at [facility]   PATIENT NAME: Jamie Norton    MR#:  [id number]  DATE OF BIRTH:  1922-03-15  SUBJECTIVE:  CHIEF COMPLAINT:   Chief Complaint  Patient presents with  . other     aspirated while eating pudding at lunch   Chest x-ray done yesterday shows worsening of her pneumonia. Patient put back on 5 L of oxygen again. Discharge being canceled for today. Continue flutter valve. IV anti-biotic's have been added. 3 episodes of loose stools last night. C. difficile is pending.  REVIEW OF SYSTEMS:  ROS Constitutional: Negative for fever and chills.  Eyes: Negative for blurred vision.  Respiratory: Positive for cough and shortness of breath. Negative for wheezing.  Cardiovascular: Negative for chest pain and palpitations.  Gastrointestinal: Negative for nausea, vomiting, abdominal pain, diarrhea and constipation.  Genitourinary: Negative for dysuria.  Neurological: Negative for dizziness, seizures and headaches.   DRUG ALLERGIES:   Allergies  Allergen Reactions  . Amoxicillin Itching  . Doxycycline Other (See Comments)    Reaction: Unknown  . Polytrim [Polymyxin B-Trimethoprim] Other (See Comments)    Reaction: Unknown  . Tolmetin Itching    VITALS:  Blood pressure 155/83, pulse 95, temperature 97.6 F (36.4 C), temperature source Oral, resp. rate 18, height  (1.575 m), weight 45.813 kg (101 lb), SpO2 97 %.  PHYSICAL EXAMINATION:  Physical Exam  GENERAL:  81 y.o.-year-old patient lying in the bed with no acute distress.  EYES: Pupils equal, round, reactive to light and accommodation. No scleral icterus. Extraocular muscles intact.  HEENT: Head atraumatic, normocephalic. Oropharynx and nasopharynx clear.  NECK:  Supple, no jugular venous distention. No thyroid enlargement, no tenderness.  LUNGS:  Decreased bibasilar breath sounds. Scant breath sounds bilaterally. No wheezing but some rhonchi at the bases and diffuse  crackles. Use of accessory muscles for breathing. CARDIOVASCULAR: S1, S2 normal. Systolic murmur is present, no rubs or gallops. ABDOMEN: Soft, nontender, nondistended. Bowel sounds present. No organomegaly or mass.  EXTREMITIES: No pedal edema, cyanosis, or clubbing.  NEUROLOGIC: Cranial nerves II through XII are intact. Muscle strength 5/5 in all extremities. Sensation intact. Gait not checked.  PSYCHIATRIC: The patient is alert and oriented x 3.  SKIN: No obvious rash, lesion, or ulcer.    LABORATORY PANEL:   CBC  Recent Labs Lab 09/20/14 1618  WBC 14.8*  HGB 15.1  HCT 45.9  PLT 222   ------------------------------------------------------------------------------------------------------------------  Chemistries   Recent Labs Lab 09/20/14 1618 09/20/14 2000 09/22/14 0609  NA 141  --  138  K 3.0*  --  4.9  CL 95*  --  106  CO2 32  --  27  GLUCOSE 184*  --  150*  BUN 30*  --  27*  CREATININE 0.91  --  0.72  CALCIUM 9.5  --  8.5*  MG  --  1.7  --   AST 73*  --   --   ALT 48  --   --   ALKPHOS 120  --   --   BILITOT 0.6  --   --    ------------------------------------------------------------------------------------------------------------------  Cardiac Enzymes  Recent Labs Lab 09/20/14 1618  TROPONINI 0.03   ------------------------------------------------------------------------------------------------------------------  RADIOLOGY:  Dg Chest 2 View  09/23/2014   CLINICAL DATA:  Shortness of breath, pneumonia  EXAM: CHEST  2 VIEW  COMPARISON:  09/22/2014  FINDINGS: Increased right basilar and left upper lobe consolidation is noted with near complete opacification of the left hemi  thorax. Moderate enlargement of the cardiac silhouette reidentified. Atheromatous aortic calcification is noted. Small bilateral pleural effusions.  IMPRESSION: Increased right lower lobe and left upper lobe consolidation compatible with worsening pneumonia. ARDS could appear similar.    Electronically Signed   By: Christiana PellantGretchen  Green M.D.   On: 09/23/2014 09:23   Dg Chest 2 View  09/22/2014   CLINICAL DATA:  Shortness of breath and chest congestion for several months admitted 3 days ago.  EXAM: CHEST  2 VIEW  COMPARISON:  09/20/2014  FINDINGS: Lungs are adequately inflated and demonstrate worsening diffuse airspace consolidation within the left lung with mild worsening small left effusion. Subtle patchy opacification over the right mid to lower lung slightly worse. Findings likely due to multifocal pneumonia. Mild stable cardiomegaly. Remainder of the exam is unchanged.  IMPRESSION: Worsening diffuse airspace process of the left lung with worsening patchy hazy airspace process over the right mid to lower lung likely a multifocal pneumonia. Slight worsening small left pleural effusion.  Stable cardiomegaly.   Electronically Signed   By: Elberta Fortisaniel  Boyle M.D.   On: 09/22/2014 12:46    EKG:  No orders found for this or any previous visit.  ASSESSMENT AND PLAN:   81 year old elderly female with past medical history significant for atrial fibrillation on Coumadin, dementia, and anemia presents from home secondary to possible aspiration.  #1 acute hypoxic respiratory failure-secondary to aspiration pneumonia-presenting with acute respiratory distress, tachypnea and hypoxia.  - O2 requirements have increased to 5 L. Encourage flutter valve. Antipyrotic has been changed to vancomycin and meropenem.  - Continue Combivent for her COPD changes on chest x-ray.  - Speech therapy evaluation is appreciated. Patient can continue soft diet with pills crushed in applesauce. - Low-dose Lasix has been added. Also do a nebs when necessary.  #2 hypokalemia- replaced and resolved now  #3 atrial fibrillation-rate better controlled after adding Coreg. On Coumadin for anticoagulation. INR is therapeutic now.. Pharmacy adjusting Coumadin dose. She is restarted on Coumadin again.  #4 history of  gastritis-continue Protonix.  Physical therapy evaluation, likely [facility] whenever her respiratory status improves.  All the records are reviewed and case discussed with Care Management/Social Workerr. Management plans discussed with the patient, family and they are in agreement.  CODE STATUS: Full Code  TOTAL TIME TAKING CARE OF THIS PATIENT: 40 minutes.   POSSIBLE D/C 2-3 DAYS, DEPENDING ON CLINICAL CONDITION.   Enid BaasKALISETTI,Kortnie Stovall M.D on 09/23/2014 at 10:36 AM  Between 7am to 6pm - Pager - [phone]  After 6pm go to [email or web address] - password [email or web address] [facility]  [facility]  Office  [phone]  CC: Primary care physician; Ruthe Mannanalia Aron, MD

## 2014-09-23 NOTE — Progress Notes (Signed)
Speech Language Pathology Treatment: Dysphagia  Patient Details Name: Jamie Norton MRN: [id number] DOB: March 26, 1922 Today's Date: 09/23/2014 Time: [id number] SLP Time Calculation (min) (ACUTE ONLY): 43 min  Assessment / Plan / Recommendation Clinical Impression  Pt appears to be demo. Inconsistent, s/s of aspiration w/ trials of thin liquids, moreso w/ NSG this AM. Pt does have a baseline of Cognitive decline/Dementia which may be impacting oral awareness and timing of the pharyngeal swallowing during this time of her acute illness. Pt presents w/ a worsening CXR this AM. MD consulted and agreed w/ downgrading po diet to a dysphagia diet w/ f/u of a MBSS on Monday. NSG updated.    HPI Other Pertinent Information: pt is having episodes of difficutly swallowing liquids per NSG this AM. Pt is currently on a mech soft diet w/ thin liquids w/ strict aspiration precautions.    Pertinent Vitals Pain Assessment: No/denies pain  SLP Plan  New goals to be determined pending instrumental study;MBS    Recommendations Diet recommendations: Dysphagia 1 (puree);Nectar-thick liquid Liquids provided via: Cup Medication Administration: Whole meds with puree Supervision:  (crushed if nec/able) Compensations: Small sips/bites;Slow rate Postural Changes and/or Swallow Maneuvers: Seated upright 90 degrees              Oral Care Recommendations: Oral care BID;Oral care before and after PO;Staff/trained caregiver to provide oral care Plan: New goals to be determined pending instrumental study;MBS    GO     Watson,Katherine 09/23/2014, 2:27 PM

## 2014-09-23 NOTE — Plan of Care (Addendum)
Problem: Discharge Progression Outcomes Goal: Discharge plan in place and appropriate Individualization  Pt goes by [name]. She is [facility]. She has a hx of Atrial fibrillation, Dementia, Gastritis, Anemia, Depression and is on home medications Goal: Activity appropriate for discharge plan Outcome: Not Progressing Afebrile. This am up to the bsc with 1xassist. Dyspnea on exertion, O2 sats 88% on 3.5L. O2 adjusted to 5L, O2 sats up to 92%. Enteric precautions to r/o C-diff initiated at bedtime, but no BM during the night. Goal: Other Discharge Outcomes/Goals Outcome: Progressing

## 2014-09-23 NOTE — Plan of Care (Signed)
Problem: Discharge Progression Outcomes Goal: Other Discharge Outcomes/Goals Outcome: Not Progressing Patient remains of 5L o2 in the mid to upper 90's but desats to 91% if lowered to 4L Pain does not tolerate thin liquid , speech consult was done with diet being changed to dysphagia I with thicken liquids and Barium Swallow was ordered by MD  [facility]

## 2014-09-23 NOTE — Progress Notes (Signed)
ANTICOAGULATION CONSULT NOTE - Follow Up Consult  Pharmacy Consult for Coumadin Indication: atrial fibrillation  Allergies  Allergen Reactions  . Amoxicillin Itching  . Doxycycline Other (See Comments)    Reaction: Unknown  . Polytrim [Polymyxin B-Trimethoprim] Other (See Comments)    Reaction: Unknown  . Tolmetin Itching    Patient Measurements: Height:  (157.5 cm) Weight: 101 lb (45.813 kg) IBW/kg (Calculated) : 50.1 Heparin Dosing Weight: n/a  Vital Signs: Temp: 97.6 F (36.4 C) (05/28 0956) Temp Source: Oral (05/28 0956) BP: 155/83 mmHg (05/28 1001) Pulse Rate: 95 (05/28 1001)  Labs:  Recent Labs  09/20/14 1618 09/21/14 0448 09/22/14 0609 09/23/14 0424  HGB 15.1  --   --   --   HCT 45.9  --   --   --   PLT 222  --   --   --   APTT 34  --   --   --   LABPROT 29.7* 37.4* 28.4* 23.7*  INR 2.81 3.80 2.66 2.10  CREATININE 0.91  --  0.72  --   TROPONINI 0.03  --   --   --     Estimated Creatinine Clearance: 33.8 mL/min (by C-G formula based on Cr of 0.72).   Medications:  Prescriptions prior to admission  Medication Sig Dispense Refill Last Dose  . Calcium Citrate-Vitamin D (CITRACAL PETITES/VITAMIN D PO) Take 2 tablets by mouth 2 (two) times daily.   09/20/2014 at 0800  . Carboxymethylcellulose Sodium (REFRESH LIQUIGEL OP) Apply 1 application to eye daily.   09/19/2014 at 2200  . cholecalciferol (VITAMIN D) 1000 UNITS tablet Take 1,000 Units by mouth daily.   09/20/2014 at 0800  . donepezil (ARICEPT) 10 MG tablet Take 10 mg by mouth at bedtime.   09/19/2014 at 2200  . furosemide (LASIX) 20 MG tablet Take 20 mg by mouth 2 (two) times daily.   09/20/2014 at 0700  . gabapentin (NEURONTIN) 100 MG capsule Take 100 mg by mouth 3 (three) times daily as needed (for pain).   PRN at PRN  . Multiple Vitamins-Minerals (MULTIVITAMIN ADULTS 50+ PO) Take 1 tablet by mouth daily.   09/20/2014 at 0800  . omeprazole (PRILOSEC) 40 MG capsule Take 40 mg by mouth daily.    09/20/2014 at Unknown time  . Polyethyl Glycol-Propyl Glycol 0.4-0.3 % SOLN Apply 1 drop to eye 2 (two) times daily.   09/20/2014 at 0800  . sertraline (ZOLOFT) 50 MG tablet Take 50 mg by mouth at bedtime.   09/19/2014 at 2200  . [EXPIRED] sulfamethoxazole-trimethoprim (BACTRIM DS,SEPTRA DS) 800-160 MG per tablet Take 1 tablet by mouth 2 (two) times daily.   09/20/2014 at 0800  . vitamin C (ASCORBIC ACID) 500 MG tablet Take 500 mg by mouth daily.   09/20/2014 at 0800  . warfarin (COUMADIN) 2 MG tablet Take 2 mg by mouth daily. Pt takes  tablet on Monday-Wednesday-Friday. Pt takes  tablet on Tuesday-Thursday-Saturday-Sunday.   09/18/2014 at 1645  . warfarin (COUMADIN) 3 MG tablet Take 3 mg by mouth daily. Pt takes  tablet on Tuesday-Thursday-Saturday-Sunday. Pt takes  tablet on Monday-Wednesday-Friday.   09/19/2014 at 1645   Scheduled:  . acidophilus  1 capsule Oral BID  . carvedilol  3.125 mg Oral BID WC  . donepezil  10 mg Oral QHS  . feeding supplement (ENSURE ENLIVE)  237 mL Oral BID BM  . furosemide  20 mg Oral Daily  . guaiFENesin  600 mg Oral BID  . ipratropium-albuterol  3 mL Nebulization  QID  . meropenem (MERREM) IV  1 g Intravenous Q12H  . pantoprazole  40 mg Oral Daily  . polyethylene glycol  17 g Oral Daily  . sertraline  50 mg Oral QHS  . vancomycin  500 mg Intravenous Q24H  . warfarin  2 mg Oral Once per day on Mon Wed Fri  . warfarin  3 mg Oral Once per day on Sun Tue Thu Sat  . Warfarin - Pharmacist Dosing Inpatient   Does not apply q1800    Assessment: Patient on Coumadin for a-fib PTA, home dose of 2mg  on MWF and 3mg  on TThSaSu.   Goal of Therapy:  INR 2-3 Monitor platelets by anticoagulation protocol: Yes   Plan:  Patient on home regimen of warfarin 2 mg MWF and 3 mg Tue,Thur, Sat, Sun.  5/28: INR 2.10. Continue current regimen. INR/hgb in am.  Bari MantisKristin Paticia Moster PharmD Clinical Pharmacist 09/23/2014

## 2014-09-24 LAB — CBC
HCT: 36.7 % (ref 35.0–47.0)
Hemoglobin: 12.4 g/dL (ref 12.0–16.0)
MCH: 32.4 pg (ref 26.0–34.0)
MCHC: 33.6 g/dL (ref 32.0–36.0)
MCV: 96.4 fL (ref 80.0–100.0)
Platelets: 164 10*3/uL (ref 150–440)
RBC: 3.81 MIL/uL (ref 3.80–5.20)
RDW: 15 % — ABNORMAL HIGH (ref 11.5–14.5)
WBC: 13.3 10*3/uL — AB (ref 3.6–11.0)

## 2014-09-24 LAB — BASIC METABOLIC PANEL
Anion gap: 4 — ABNORMAL LOW (ref 5–15)
BUN: 31 mg/dL — ABNORMAL HIGH (ref 6–20)
CALCIUM: 8.4 mg/dL — AB (ref 8.9–10.3)
CHLORIDE: 105 mmol/L (ref 101–111)
CO2: 28 mmol/L (ref 22–32)
Creatinine, Ser: 0.63 mg/dL (ref 0.44–1.00)
GFR calc Af Amer: >60 mL/min (ref >60)
GFR calc non Af Amer: >60 mL/min (ref >60)
Glucose, Bld: 104 mg/dL — ABNORMAL HIGH (ref 65–99)
Potassium: 4.1 mmol/L (ref 3.5–5.1)
Sodium: 137 mmol/L (ref 135–145)

## 2014-09-24 LAB — PROTIME-INR
INR: 2.32
Prothrombin Time: 25.6 seconds — ABNORMAL HIGH (ref 11.4–15.0)

## 2014-09-24 NOTE — Plan of Care (Signed)
Problem: Discharge Progression Outcomes Goal: Discharge plan in place and appropriate Individualization  Individualization of Care:   Patient prefers to be called Jamie Norton, from [facility].  PMH: A. Fib., Dementia, Gastritis, Anemia, Depression - controlled with medications.      Goal: Other Discharge Outcomes/Goals Plan of care progress to goal:  Patient continues on 4L-O2, sat around 95%. Disoriented to time. Remains on Dys. I - Nectar Thick diet. Barium Swallow scheduled for tomorrow. No complaints of pain.

## 2014-09-24 NOTE — Progress Notes (Signed)
[facility] [place] [facility]   PATIENT NAME: Jamie Norton    MR#:  [id number]  DATE OF BIRTH:  10-Sep-1921  SUBJECTIVE:  CHIEF COMPLAINT:   Chief Complaint  Patient presents with  . other     aspirated while eating pudding at lunch   -Still has cough and dyspnea. -CXR with worsening pneumonia. Coughing with water yesterday. So modified barium swallow tomorrow. - weaned to 4l o2 from 5l.   REVIEW OF SYSTEMS:  ROS Constitutional: Negative for fever and chills.  Eyes: Negative for blurred vision.  Respiratory: Positive for cough and shortness of breath. Negative for wheezing.  Cardiovascular: Negative for chest pain and palpitations.  Gastrointestinal: Negative for nausea, vomiting, abdominal pain, diarrhea and constipation.  Genitourinary: Negative for dysuria.  Neurological: Negative for dizziness, seizures and headaches.   DRUG ALLERGIES:   Allergies  Allergen Reactions  . Amoxicillin Itching  . Doxycycline Other (See Comments)    Reaction: Unknown  . Polytrim [Polymyxin B-Trimethoprim] Other (See Comments)    Reaction: Unknown  . Tolmetin Itching    VITALS:  Blood pressure 142/79, pulse 90, temperature 98.3 F (36.8 C), temperature source Oral, resp. rate 16, height  (1.575 m), weight 45.813 kg (101 lb), SpO2 97 %.  PHYSICAL EXAMINATION:  Physical Exam  GENERAL:  81 y.o.-year-old patient lying in the bed with no acute distress.  EYES: Pupils equal, round, reactive to light and accommodation. No scleral icterus. Extraocular muscles intact.  HEENT: Head atraumatic, normocephalic. Oropharynx and nasopharynx clear.  NECK:  Supple, no jugular venous distention. No thyroid enlargement, no tenderness.  LUNGS:   Decreased bibasilar breath sounds. Diffuse crackles and rhonchi all over the lungs. Scant breath sounds bilaterally. No wheezing . Use of accessory muscles for breathing. CARDIOVASCULAR: S1, S2 normal. Systolic murmur is  present, no rubs or gallops. ABDOMEN: Soft, nontender, nondistended. Bowel sounds present. No organomegaly or mass.  EXTREMITIES: No pedal edema, cyanosis, or clubbing.  NEUROLOGIC: Cranial nerves II through XII are intact. Muscle strength 5/5 in all extremities. Sensation intact. Gait not checked.  PSYCHIATRIC: The patient is alert and oriented x 3.  SKIN: No obvious rash, lesion, or ulcer.    LABORATORY PANEL:   CBC  Recent Labs Lab 09/24/14 0428  WBC 13.3*  HGB 12.4  HCT 36.7  PLT 164   ------------------------------------------------------------------------------------------------------------------  Chemistries   Recent Labs Lab 09/20/14 1618 09/20/14 2000  09/24/14 0428  NA 141  --   < > 137  K 3.0*  --   < > 4.1  CL 95*  --   < > 105  CO2 32  --   < > 28  GLUCOSE 184*  --   < > 104*  BUN 30*  --   < > 31*  CREATININE 0.91  --   < > 0.63  CALCIUM 9.5  --   < > 8.4*  MG  --  1.7  --   --   AST 73*  --   --   --   ALT 48  --   --   --   ALKPHOS 120  --   --   --   BILITOT 0.6  --   --   --   < > = values in this interval not displayed. ------------------------------------------------------------------------------------------------------------------  Cardiac Enzymes  Recent Labs Lab 09/20/14 1618  TROPONINI 0.03   ------------------------------------------------------------------------------------------------------------------  RADIOLOGY:  Dg Chest 2 View  09/23/2014   CLINICAL DATA:  Shortness  of breath, pneumonia  EXAM: CHEST  2 VIEW  COMPARISON:  09/22/2014  FINDINGS: Increased right basilar and left upper lobe consolidation is noted with near complete opacification of the left hemi thorax. Moderate enlargement of the cardiac silhouette reidentified. Atheromatous aortic calcification is noted. Small bilateral pleural effusions.  IMPRESSION: Increased right lower lobe and left upper lobe consolidation compatible with worsening pneumonia. ARDS could appear  similar.   Electronically Signed   By: Christiana PellantGretchen  Green M.D.   On: 09/23/2014 09:23   Dg Chest 2 View  09/22/2014   CLINICAL DATA:  Shortness of breath and chest congestion for several months admitted 3 days ago.  EXAM: CHEST  2 VIEW  COMPARISON:  09/20/2014  FINDINGS: Lungs are adequately inflated and demonstrate worsening diffuse airspace consolidation within the left lung with mild worsening small left effusion. Subtle patchy opacification over the right mid to lower lung slightly worse. Findings likely due to multifocal pneumonia. Mild stable cardiomegaly. Remainder of the exam is unchanged.  IMPRESSION: Worsening diffuse airspace process of the left lung with worsening patchy hazy airspace process over the right mid to lower lung likely a multifocal pneumonia. Slight worsening small left pleural effusion.  Stable cardiomegaly.   Electronically Signed   By: Elberta Fortisaniel  Boyle M.D.   On: 09/22/2014 12:46    EKG:  No orders found for this or any previous visit.  ASSESSMENT AND PLAN:   81 year old elderly female with past medical history significant for atrial fibrillation on Coumadin, dementia, and anemia presents from home secondary to possible aspiration.  #1 acute hypoxic respiratory failure-secondary to aspiration pneumonia-presenting with acute respiratory distress, tachypnea and hypoxia.  - O2 requirements are still high at 4l. Encourage flutter valve.  Antibiotics have been changed to vancomycin and meropenem.  - will order sputum cultures - Continue Combivent for her COPD changes on chest x-ray.  - Speech therapy evaluation is appreciated. Patient can continue soft diet with pills crushed in applesauce. - Low-dose Lasix has been added. Also do a nebs when necessary.  #2 hypokalemia- replaced and resolved now  #3 atrial fibrillation-rate better controlled after adding Coreg. On Coumadin for anticoagulation. INR is therapeutic now.. Pharmacy adjusting Coumadin dose.  #4 history of  gastritis-continue Protonix.  Physical therapy evaluation, likely discharge to the rehabilitation [facility] whenever her respiratory status improves. Modified barium swallow tomorrow.  All the records are reviewed and case discussed with Care Management/Social Workerr. Management plans discussed with the patient, family and they are in agreement.  CODE STATUS: Full Code  TOTAL TIME TAKING CARE OF THIS PATIENT: 40 minutes.   POSSIBLE D/C 2-3 DAYS, DEPENDING ON CLINICAL CONDITION.   Enid BaasKALISETTI,Brandee Markin M.D on 09/24/2014 at 8:46 AM  Between 7am to 6pm - Pager - [phone]  After 6pm go to [email or web address] - password [email or web address]  [facility]  Office  [phone]  CC: Primary care physician; Ruthe Mannanalia Aron, MD

## 2014-09-24 NOTE — Progress Notes (Signed)
ANTICOAGULATION CONSULT NOTE/Vancomycin/Meropenem consult/ - Follow Up Consult  Pharmacy Consult for Coumadin/Vancomycin/Meropenem Indication: atrial fibrillation/PNA  Allergies  Allergen Reactions  . Amoxicillin Itching  . Doxycycline Other (See Comments)    Reaction: Unknown  . Polytrim [Polymyxin B-Trimethoprim] Other (See Comments)    Reaction: Unknown  . Tolmetin Itching    Patient Measurements: Height: 5\' 2"  (157.5 cm) Weight: 101 lb (45.813 kg) IBW/kg (Calculated) : 50.1 Heparin Dosing Weight: n/a  Vital Signs: Temp: 98.3 F (36.8 C) (05/29 0522) Temp Source: Oral (05/29 0522) BP: 142/79 mmHg (05/29 0522) Pulse Rate: 90 (05/29 0522)    Labs:  Recent Labs  09/22/14 0609 09/23/14 0424 09/24/14 0428  HGB  --   --  12.4  HCT  --   --  36.7  PLT  --   --  164  LABPROT 28.4* 23.7* 25.6*  INR 2.66 2.10 2.32  CREATININE 0.72  --  0.63  WBC         13.3  Estimated Creatinine Clearance: 33.8 mL/min (by C-G formula based on Cr of 0.63).   Medications:  Prescriptions prior to admission  Medication Sig Dispense Refill Last Dose  . Calcium Citrate-Vitamin D (CITRACAL PETITES/VITAMIN D PO) Take 2 tablets by mouth 2 (two) times daily.   09/20/2014 at 0800  . Carboxymethylcellulose Sodium (REFRESH LIQUIGEL OP) Apply 1 application to eye daily.   09/19/2014 at 2200  . cholecalciferol (VITAMIN D) 1000 UNITS tablet Take 1,000 Units by mouth daily.   09/20/2014 at 0800  . donepezil (ARICEPT) 10 MG tablet Take 10 mg by mouth at bedtime.   09/19/2014 at 2200  . furosemide (LASIX) 20 MG tablet Take 20 mg by mouth 2 (two) times daily.   09/20/2014 at 0700  . gabapentin (NEURONTIN) 100 MG capsule Take 100 mg by mouth 3 (three) times daily as needed (for pain).   PRN at PRN  . Multiple Vitamins-Minerals (MULTIVITAMIN ADULTS 50+ PO) Take 1 tablet by mouth daily.   09/20/2014 at 0800  . omeprazole (PRILOSEC) 40 MG capsule Take 40 mg by mouth daily.   09/20/2014 at Unknown time  .  Polyethyl Glycol-Propyl Glycol 0.4-0.3 % SOLN Apply 1 drop to eye 2 (two) times daily.   09/20/2014 at 0800  . sertraline (ZOLOFT) 50 MG tablet Take 50 mg by mouth at bedtime.   09/19/2014 at 2200  . [EXPIRED] sulfamethoxazole-trimethoprim (BACTRIM DS,SEPTRA DS) 800-160 MG per tablet Take 1 tablet by mouth 2 (two) times daily.   09/20/2014 at 0800  . vitamin C (ASCORBIC ACID) 500 MG tablet Take 500 mg by mouth daily.   09/20/2014 at 0800  . warfarin (COUMADIN) 2 MG tablet Take 2 mg by mouth daily. Pt takes 2mg  tablet on Monday-Wednesday-Friday. Pt takes 3mg  tablet on Tuesday-Thursday-Saturday-Sunday.   09/18/2014 at 1645  . warfarin (COUMADIN) 3 MG tablet Take 3 mg by mouth daily. Pt takes 3mg  tablet on Tuesday-Thursday-Saturday-Sunday. Pt takes 2mg  tablet on Monday-Wednesday-Friday.   09/19/2014 at 1645   Scheduled:  . acidophilus  1 capsule Oral BID  . carvedilol  3.125 mg Oral BID WC  . donepezil  10 mg Oral QHS  . feeding supplement (ENSURE ENLIVE)  237 mL Oral BID BM  . furosemide  20 mg Oral Daily  . guaiFENesin  600 mg Oral BID  . ipratropium-albuterol  3 mL Nebulization QID  . meropenem (MERREM) IV  1 g Intravenous Q12H  . pantoprazole  40 mg Oral Daily  . polyethylene glycol  17 g Oral Daily  .  sertraline  50 mg Oral QHS  . vancomycin  500 mg Intravenous Q24H  . warfarin  2 mg Oral Once per day on Mon Wed Fri  . warfarin  3 mg Oral Once per day on Sun Tue Thu Sat  . Warfarin - Pharmacist Dosing Inpatient   Does not apply q1800     Results for orders placed or performed during the hospital encounter of 09/20/14  Blood culture (routine x 2)     Status: None (Preliminary result)   Collection Time: 09/20/14  4:20 PM  Result Value Ref Range Status   Specimen Description BLOOD  Final   Special Requests NONE  Final   Culture NO GROWTH 4 DAYS  Final   Report Status PENDING  Incomplete  Blood culture (routine x 2)     Status: None (Preliminary result)   Collection Time: 09/20/14  8:00 PM   Result Value Ref Range Status   Specimen Description BLOOD  Final   Special Requests NONE  Final   Culture NO GROWTH 4 DAYS  Final   Report Status PENDING  Incomplete  MRSA PCR Screening     Status: None   Collection Time: 09/21/14  3:45 AM  Result Value Ref Range Status   MRSA by PCR NEGATIVE NEGATIVE Final    Comment:        The GeneXpert MRSA Assay (FDA approved for NASAL specimens only), is one component of a comprehensive MRSA colonization surveillance program. It is not intended to diagnose MRSA infection nor to guide or monitor treatment for MRSA infections.   Urine culture     Status: None   Collection Time: 09/21/14 11:02 AM  Result Value Ref Range Status   Specimen Description URINE, RANDOM  Final   Special Requests NONE  Final   Culture NO GROWTH 2 DAYS  Final   Report Status 09/23/2014 FINAL  Final  C difficile quick scan w PCR reflex [facility])     Status: None   Collection Time: 09/23/14  7:33 AM  Result Value Ref Range Status   C Diff antigen NEGATIVE  Final   C Diff toxin NEGATIVE  Final   C Diff interpretation Negative for C. difficile  Final    Assessment: Patient on Coumadin for a-fib PTA, home dose of  on MWF and  on TThSaSu. Patient on Vancomycin  IV Q24h and Meropenem 1 gram IV Q12h for PNA. Per MD- CXR with worsening PNA, mod.barium swallow.  Goal of Therapy:  INR 2-3 Monitor platelets by anticoagulation protocol: Yes  5/28: INR 2.10. Continue current regimen.  5/29 INR 2.32  Vanc trough 15-20 mcg/ml Will check Vanc trough 5/30 at 1130.  Plan:  Continue on patient's home regimen of warfarin 2 mg MWF and 3 mg Tue,Thur, Sat, Sun. INR/hgb in am. F/U Vancomycin level. Continue Meropenem.  Bari Mantis PharmD Clinical Pharmacist 09/24/2014

## 2014-09-24 NOTE — Plan of Care (Signed)
Problem: Discharge Progression Outcomes Goal: Barriers To Progression Addressed/Resolved Individualization  Plan of care progress to goals: Pt goes by Jamie Norton. [facility]. She has a hx of Atrial fibrillation, Dementia, Gastritis, Anemia, Depression and is on home medications Goal: Other Discharge Outcomes/Goals Plan of care progress to goals: Afebrile. BP/HR stable. O2 sats 94% on 4L at rest. Rested well.

## 2014-09-25 ENCOUNTER — Inpatient Hospital Stay: Payer: Medicare Other

## 2014-09-25 LAB — CULTURE, BLOOD (ROUTINE X 2)
CULTURE: NO GROWTH
CULTURE: NO GROWTH

## 2014-09-25 LAB — PROTIME-INR
INR: 2.99
Prothrombin Time: 31.1 seconds — ABNORMAL HIGH (ref 11.4–15.0)

## 2014-09-25 LAB — VANCOMYCIN, TROUGH: VANCOMYCIN TR: 6 ug/mL — AB (ref 10–20)

## 2014-09-25 MED ORDER — VANCOMYCIN HCL IN DEXTROSE 750-5 MG/150ML-% IV SOLN
750.0000 mg | INTRAVENOUS | Status: DC
  Administered 2014-09-25: 750 mg via INTRAVENOUS
  Filled 2014-09-25 (×4): qty 150

## 2014-09-25 NOTE — Progress Notes (Signed)
Attempted to wean patient to 2L-O2, sat 89%. Placed patient back on 3L-O2 nasal cannula. Bo McclintockBrewer,Najiyah Paris S, RN

## 2014-09-25 NOTE — Procedures (Signed)
Objective Swallowing Evaluation: Other (Comment) (MBSS)  Patient Details  Name: Jamie Norton MRN: [id number] Date of Birth: 10-12-1921  Today's Date: 09/25/2014 Time: SLP Start Time (ACUTE ONLY): 0815-SLP Stop Time (ACUTE ONLY): 0920 SLP Time Calculation (min) (ACUTE ONLY): 65 min  Past Medical History:  Past Medical History  Diagnosis Date  . Atrial fibrillation   . Dementia   . Gastritis   . Anemia   . Depression    Past Surgical History: History reviewed. No pertinent past surgical history. HPI:  Other Pertinent Information: pt continued to demo. inconsistency w/ toleration of rec'd diet over the weekend thus the diet was modified to a Dys. I w/ Nectar liquids. Noted a decline in CXR as well. Discussion w/ MD also.   No Data Recorded  Assessment / Plan / Recommendation CHL IP CLINICAL IMPRESSIONS 09/25/2014  Therapy Diagnosis Moderate pharyngeal phase dysphagia  Clinical Impression Pt appears to present w/ moderate pharyngeal phase dysphagia w/ mild oral phase dysphagia. During the oral phase, noted min. delay in A-P transit and min. oral reside post initial swallow. Pt was able to clear the majority w/ a f/u swallow. During the pharyngeal phase of swallowing, pt exhibited increased pharyngeal residue w/ all bolus consistencies resulting in slight-min. laryngeal penetration/aspiration AFTER the swallow. Pt was able to use the strategy of Throat Clearing and expectoration w/ min. verbal cues and this appeared to clear the laryngeal vestibule of bolus residue 3/3 trials. However, pt does remain at increased risk for chronic aspiration of bolus reisdue. Suspect pt's Esophageal phase presentation of slow motility and clearing may be impacting the pharyngeal phase thus increasing risk for increased pharyngeal residue to remain and laryngeal penetration/aspiration of such to occur.        CHL IP TREATMENT RECOMMENDATION 09/25/2014  Treatment Recommendations Therapy as outlined in  treatment plan below     CHL IP DIET RECOMMENDATION 09/25/2014  SLP Diet Recommendations Dysphagia 2 (Fine chop);Thin  Liquid Administration via (None)  Medication Administration Crushed with puree  Compensations Small sips/bites;Slow rate  Postural Changes and/or Swallow Maneuvers (None)   Diet Recommendation w/ Strategies/Precautions:  Dys. II w/ Thin liquids for less mastication effort/time and for easier Esophageal motility and clearing;  Throat Clear strategy every ~2 bites/sips to attempt to clear laryngeal vestibule of any penetrated pharyngeal residue;  Alternating bites of food w/ liquids - small, single bites and sips.  Time b/t each for f/u swallows to reduce pharyngeal residue;  Moisten foods well;  Small, more frequent meals/snacks;  Nutritional supplements - drink, per Dietician.     CHL IP OTHER RECOMMENDATIONS 09/25/2014  Recommended Consults (No Data)  Oral Care Recommendations Oral care BID;Oral care before and after PO;Staff/trained caregiver to provide oral care  Other Recommendations Other (Comment)     No flowsheet data found.   CHL IP FREQUENCY AND DURATION 09/25/2014  Speech Therapy Frequency (ACUTE ONLY) min 3x week  Treatment Duration 1 week     Pertinent Vitals/Pain None indicated    SLP Swallow Goals See care plan  No flowsheet data found.    CHL IP REASON FOR REFERRAL 09/25/2014  Reason for Referral Objectively evaluate swallowing function     CHL IP ORAL PHASE 09/25/2014  Lips (None)  Tongue (None)  Mucous membranes (None)  Nutritional status (None)  Other (None)  Oxygen therapy (None)  Oral Phase Impaired  Oral - Pudding Teaspoon (None)  Oral - Pudding Cup (None)  Oral - Honey Teaspoon (None)  Oral - Honey Cup (  None)  Oral - Honey Syringe (None)  Oral - Nectar Teaspoon (None)  Oral - Nectar Cup (None)  Oral - Nectar Straw (None)  Oral - Nectar Syringe (None)  Oral - Ice Chips (None)  Oral - Thin Teaspoon (None)  Oral - Thin Cup (None)   Oral - Thin Straw (None)  Oral - Thin Syringe (None)  Oral - Puree (None)  Oral - Mechanical Soft (None)  Oral - Regular (None)  Oral - Multi-consistency (None)  Oral - Pill (None)  Oral Phase - Comment (None)  Pt exhibited delayed A-P transit and min. Oral residue post trials. She was able to clear the oral residue w/ a f/u swallow and lingual sweep independently.      CHL IP PHARYNGEAL PHASE 09/25/2014  Pharyngeal Phase Impaired  Pharyngeal - Pudding Teaspoon (None)  Penetration/Aspiration details (pudding teaspoon) (None)  Pharyngeal - Pudding Cup (None)  Penetration/Aspiration details (pudding cup) (None)  Pharyngeal - Honey Teaspoon (None)  Penetration/Aspiration details (honey teaspoon) (None)  Pharyngeal - Honey Cup (None)  Penetration/Aspiration details (honey cup) (None)  Pharyngeal - Honey Syringe (None)  Penetration/Aspiration details (honey syringe) (None)  Pharyngeal - Nectar Teaspoon (None)  Penetration/Aspiration details (nectar teaspoon) (None)  Pharyngeal - Nectar Cup (None)  Penetration/Aspiration details (nectar cup) (None)  Pharyngeal - Nectar Straw (None)  Penetration/Aspiration details (nectar straw) (None)  Pharyngeal - Nectar Syringe (None)  Penetration/Aspiration details (nectar syringe) (None)  Pharyngeal - Ice Chips (None)  Penetration/Aspiration details (ice chips) (None)  Pharyngeal - Thin Teaspoon (None)  Penetration/Aspiration details (thin teaspoon) (None)  Pharyngeal - Thin Cup (None)  Penetration/Aspiration details (thin cup) (None)  Pharyngeal - Thin Straw (None)  Penetration/Aspiration details (thin straw) (None)  Pharyngeal - Thin Syringe (None)  Penetration/Aspiration details (thin syringe') (None)  Pharyngeal - Puree (None)  Penetration/Aspiration details (puree) (None)  Pharyngeal - Mechanical Soft (None)  Penetration/Aspiration details (mechanical soft) (None)  Pharyngeal - Regular (None)  Penetration/Aspiration details (regular)  (None)  Pharyngeal - Multi-consistency (None)  Penetration/Aspiration details (multi-consistency) (None)  Pharyngeal - Pill (None)  Penetration/Aspiration details (pill) (None)  Pharyngeal Comment (None)  Pt presented w/ mild-mod. Pharyngeal residue w/ all consistencies indicating reduce pharyngeal pressure and laryngeal excursion/movement during the swallowing. This resulted in min.+ laryngeal penetration and aspiration(slight x1) w/ consistencies. Pt was able to use a Throat Clear strategy to reduce/clear the bolus residue in the laryngeal vestibule.     CHL IP CERVICAL ESOPHAGEAL PHASE 09/25/2014  Cervical Esophageal Phase Impaired  Pudding Teaspoon (None)  Pudding Cup (None)  Honey Teaspoon (None)  Honey Cup (None)  Honey Straw (None)  Nectar Teaspoon (None)  Nectar Cup (None)  Nectar Straw (None)  Nectar Sippy Cup (None)  Thin Teaspoon (None)  Thin Cup (None)  Thin Straw (None)  Thin Sippy Cup (None)  Cervical Esophageal Comment This reduced motility in the Esophagus could be impacting the motility and cleraing of the pharynx during the swallowing thus increasing pharyngeal residue increasing risk for the laryngeal penetration/aspiration to occur.    No flowsheet data found.         Watson,Katherine 09/25/2014, 2:24 PM

## 2014-09-25 NOTE — Plan of Care (Signed)
Problem: Discharge Progression Outcomes Goal: Discharge plan in place and appropriate Individualization  Individualization of Care:   Patient prefers to be called [name], from [facility].   PMH: A. Fib., Dementia, Gastritis, Anemia, Depression - controlled with medications.    Goal: Other Discharge Outcomes/Goals Plan of care progress to goal:  Patient on 3L-O2, sat 93%. Barium Swallow completed today. Patient on DYS 2, Thin Liquid Diet - NO STRAWS. Chest xray scheduled for tomorrow AM. No complaints of pain. [facility] with one person assist.

## 2014-09-25 NOTE — Progress Notes (Signed)
ANTICOAGULATION CONSULT NOTE/Vancomycin/Meropenem consult/ - Follow Up Consult  Pharmacy Consult for Coumadin/Vancomycin/Meropenem Indication: atrial fibrillation/PNA  Allergies  Allergen Reactions  . Amoxicillin Itching  . Doxycycline Other (See Comments)    Reaction: Unknown  . Polytrim [Polymyxin B-Trimethoprim] Other (See Comments)    Reaction: Unknown  . Tolmetin Itching    Patient Measurements: Height:  (157.5 cm) Weight: 101 lb (45.813 kg) IBW/kg (Calculated) : 50.1 Heparin Dosing Weight: n/a  Vital Signs: Temp: 97.7 F (36.5 C) (05/30 0537) Temp Source: Oral (05/30 0537) BP: 152/71 mmHg (05/30 0537) Pulse Rate: 85 (05/30 0537)   Labs:  Recent Labs  09/23/14 0424 09/24/14 0428 09/25/14 0424  HGB  --  12.4  --   HCT  --  36.7  --   PLT  --  164  --   LABPROT 23.7* 25.6* 31.1*  INR 2.10 2.32 2.99  CREATININE  --  0.63  --   WBC         13.3  Estimated Creatinine Clearance: 33.8 mL/min (by C-G formula based on Cr of 0.63).  Medications:  Prescriptions prior to admission  Medication Sig Dispense Refill Last Dose  . Calcium Citrate-Vitamin D (CITRACAL PETITES/VITAMIN D PO) Take 2 tablets by mouth 2 (two) times daily.   09/20/2014 at 0800  . Carboxymethylcellulose Sodium (REFRESH LIQUIGEL OP) Apply 1 application to eye daily.   09/19/2014 at 2200  . cholecalciferol (VITAMIN D) 1000 UNITS tablet Take 1,000 Units by mouth daily.   09/20/2014 at 0800  . donepezil (ARICEPT) 10 MG tablet Take 10 mg by mouth at bedtime.   09/19/2014 at 2200  . furosemide (LASIX) 20 MG tablet Take 20 mg by mouth 2 (two) times daily.   09/20/2014 at 0700  . gabapentin (NEURONTIN) 100 MG capsule Take 100 mg by mouth 3 (three) times daily as needed (for pain).   PRN at PRN  . Multiple Vitamins-Minerals (MULTIVITAMIN ADULTS 50+ PO) Take 1 tablet by mouth daily.   09/20/2014 at 0800  . omeprazole (PRILOSEC) 40 MG capsule Take 40 mg by mouth daily.   09/20/2014 at Unknown time  . Polyethyl  Glycol-Propyl Glycol 0.4-0.3 % SOLN Apply 1 drop to eye 2 (two) times daily.   09/20/2014 at 0800  . sertraline (ZOLOFT) 50 MG tablet Take 50 mg by mouth at bedtime.   09/19/2014 at 2200  . [EXPIRED] sulfamethoxazole-trimethoprim (BACTRIM DS,SEPTRA DS) 800-160 MG per tablet Take 1 tablet by mouth 2 (two) times daily.   09/20/2014 at 0800  . vitamin C (ASCORBIC ACID) 500 MG tablet Take 500 mg by mouth daily.   09/20/2014 at 0800  . warfarin (COUMADIN) 2 MG tablet Take 2 mg by mouth daily. Pt takes  tablet on Monday-Wednesday-Friday. Pt takes  tablet on Tuesday-Thursday-Saturday-Sunday.   09/18/2014 at 1645  . warfarin (COUMADIN) 3 MG tablet Take 3 mg by mouth daily. Pt takes  tablet on Tuesday-Thursday-Saturday-Sunday. Pt takes  tablet on Monday-Wednesday-Friday.   09/19/2014 at 1645   Scheduled:  . acidophilus  1 capsule Oral BID  . carvedilol  3.125 mg Oral BID WC  . donepezil  10 mg Oral QHS  . feeding supplement (ENSURE ENLIVE)  237 mL Oral BID BM  . furosemide  20 mg Oral Daily  . ipratropium-albuterol  3 mL Nebulization QID  . meropenem (MERREM) IV  1 g Intravenous Q12H  . pantoprazole  40 mg Oral Daily  . polyethylene glycol  17 g Oral Daily  . sertraline  50 mg Oral QHS  .  vancomycin  750 mg Intravenous Q24H  . warfarin  2 mg Oral Once per day on Mon Wed Fri  . warfarin  3 mg Oral Once per day on Sun Tue Thu Sat  . Warfarin - Pharmacist Dosing Inpatient   Does not apply q1800     Results for orders placed or performed during the hospital encounter of 09/20/14  Blood culture (routine x 2)     Status: None   Collection Time: 09/20/14  4:20 PM  Result Value Ref Range Status   Specimen Description BLOOD  Final   Special Requests NONE  Final   Culture NO GROWTH 5 DAYS  Final   Report Status 09/25/2014 FINAL  Final  Blood culture (routine x 2)     Status: None   Collection Time: 09/20/14  8:00 PM  Result Value Ref Range Status   Specimen Description BLOOD  Final   Special  Requests NONE  Final   Culture NO GROWTH 5 DAYS  Final   Report Status 09/25/2014 FINAL  Final  MRSA PCR Screening     Status: None   Collection Time: 09/21/14  3:45 AM  Result Value Ref Range Status   MRSA by PCR NEGATIVE NEGATIVE Final    Comment:        The GeneXpert MRSA Assay (FDA approved for NASAL specimens only), is one component of a comprehensive MRSA colonization surveillance program. It is not intended to diagnose MRSA infection nor to guide or monitor treatment for MRSA infections.   Urine culture     Status: None   Collection Time: 09/21/14 11:02 AM  Result Value Ref Range Status   Specimen Description URINE, RANDOM  Final   Special Requests NONE  Final   Culture NO GROWTH 2 DAYS  Final   Report Status 09/23/2014 FINAL  Final  C difficile quick scan w PCR reflex [facility])     Status: None   Collection Time: 09/23/14  7:33 AM  Result Value Ref Range Status   C Diff antigen NEGATIVE  Final   C Diff toxin NEGATIVE  Final   C Diff interpretation Negative for C. difficile  Final    Assessment: Patient on Coumadin for a-fib PTA, home dose of 2mg  on MWF and 3mg  on TThSaSu. Patient on Vancomycin 500mg  IV Q24h and Meropenem 1 gram IV Q12h for PNA. Per MD- CXR with worsening PNA, mod.barium swallow.  Goal of Therapy:  INR 2-3 Monitor platelets by anticoagulation protocol: Yes  5/28: INR 2.10 5/29 INR 2.32 5/30: INR 2.99  Vanc trough 5/30 = 6 (not at steady state) Vanc trough 15-20 mcg/ml  Plan:  Continue on patient's home regimen of warfarin 2 mg MWF and 3 mg Tue,Thur, Sat, Sun. Follow-up INR in am.  Will adjust vancomycin to 750mg  IV q24h. Will order F/U Vancomycin level after 3rd dose of new regimen on 6/2 at 1230.Continue Meropenem.  Elvera MariaSonja Cairo Lingenfelter, PharmD  09/25/2014

## 2014-09-25 NOTE — Progress Notes (Signed)
[facility] - [place] [facility]   PATIENT NAME: Jamie Norton    MR#:  [id number]  DATE OF BIRTH:  09-15-1921  SUBJECTIVE:  CHIEF COMPLAINT:   Chief Complaint  Patient presents with  . other     aspirated while eating pudding at lunch   -failed modified barium swallow as risk of aspiration present- but can be overcome to some etent with strategies - Discussed with son Jamie Norton about PEG tube vs wait and watch approach- family will decide and discuss with patient and let me know tomorrow - pt weaned down to 3l nasal cannula today.  REVIEW OF SYSTEMS:  ROS Constitutional: Negative for fever and chills.  Eyes: Negative for blurred vision.  Respiratory: Positive for cough and shortness of breath. Negative for wheezing.  Cardiovascular: Negative for chest pain and palpitations.  Gastrointestinal: Negative for nausea, vomiting, abdominal pain, diarrhea and constipation.  Genitourinary: Negative for dysuria.  Neurological: Negative for dizziness, seizures and headaches.   DRUG ALLERGIES:   Allergies  Allergen Reactions  . Amoxicillin Itching  . Doxycycline Other (See Comments)    Reaction: Unknown  . Polytrim [Polymyxin B-Trimethoprim] Other (See Comments)    Reaction: Unknown  . Tolmetin Itching    VITALS:  Blood pressure 152/71, pulse 85, temperature 97.7 F (36.5 C), temperature source Oral, resp. rate 18, height  (1.575 m), weight 45.813 kg (101 lb), SpO2 94 %.  PHYSICAL EXAMINATION:  Physical Exam  GENERAL:  81 y.o.-year-old patient lying in the bed with no acute distress.  EYES: Pupils equal, round, reactive to light and accommodation. No scleral icterus. Extraocular muscles intact.  HEENT: Head atraumatic, normocephalic. Oropharynx and nasopharynx clear.  NECK:  Supple, no jugular venous distention. No thyroid enlargement, no tenderness.  LUNGS:   Decreased bibasilar breath sounds. Diffuse crackles and rhonchi all over the  lungs. Scant breath sounds bilaterally. No wheezing . Use of accessory muscles for breathing. CARDIOVASCULAR: S1, S2 normal. Systolic murmur is present, no rubs or gallops. ABDOMEN: Soft, nontender, nondistended. Bowel sounds present. No organomegaly or mass.  EXTREMITIES: No pedal edema, cyanosis, or clubbing.  NEUROLOGIC: Cranial nerves II through XII are intact. Muscle strength 5/5 in all extremities. Sensation intact. Gait not checked.  PSYCHIATRIC: The patient is alert and oriented x 3.  SKIN: No obvious rash, lesion, or ulcer.    LABORATORY PANEL:   CBC  Recent Labs Lab 09/24/14 0428  WBC 13.3*  HGB 12.4  HCT 36.7  PLT 164   ------------------------------------------------------------------------------------------------------------------  Chemistries   Recent Labs Lab 09/20/14 1618 09/20/14 2000  09/24/14 0428  NA 141  --   < > 137  K 3.0*  --   < > 4.1  CL 95*  --   < > 105  CO2 32  --   < > 28  GLUCOSE 184*  --   < > 104*  BUN 30*  --   < > 31*  CREATININE 0.91  --   < > 0.63  CALCIUM 9.5  --   < > 8.4*  MG  --  1.7  --   --   AST 73*  --   --   --   ALT 48  --   --   --   ALKPHOS 120  --   --   --   BILITOT 0.6  --   --   --   < > = values in this interval not displayed. ------------------------------------------------------------------------------------------------------------------  Cardiac Enzymes  Recent Labs Lab 09/20/14 1618  TROPONINI 0.03   ------------------------------------------------------------------------------------------------------------------  RADIOLOGY:  No results found.  EKG:  No orders found for this or any previous visit.  ASSESSMENT AND PLAN:   81 year old elderly female with past medical history significant for atrial fibrillation on Coumadin, dementia, and anemia presents from home secondary to possible aspiration.  #1 acute hypoxic respiratory failure-secondary to aspiration pneumonia-presenting with acute  respiratory distress, tachypnea and hypoxia.  - O2 requirements are improving- now at 3l. Encourage flutter valve.  On vancomycin and meropenem.  - f/u CXR tomorrow and likely narrow Antibiotics - Continue Combivent for her COPD changes on chest x-ray.  - Speech therapy evaluation is appreciated. Patient can continue soft diet with pills crushed in applesauce. - Low-dose Lasix has been added. Also do a nebs when necessary.  #2 Dysphagia- appreciate speech therapist consult. Pt had modified barium swallow study done this am and did not do so well. Discussed about PEG tube vs strategies/techniques to son and patient- will get back to me tomorrow - until then diet per speech recommendations - likely has been having chronic apsiration  #3 atrial fibrillation-rate better controlled after adding Coreg. On Coumadin for anticoagulation. INR is therapeutic now.. Pharmacy adjusting Coumadin dose.  #4 history of gastritis-continue Protonix.  Physical therapy evaluation, likely discharge to the rehabilitation part [facility] whenever her respiratory status improves.  All the records are reviewed and case discussed with Care Management/Social Workerr. Management plans discussed with the patient, family and they are in agreement.  CODE STATUS: Full Code  TOTAL TIME TAKING CARE OF THIS PATIENT: 40 minutes.   POSSIBLE D/C 2-3 DAYS, DEPENDING ON CLINICAL CONDITION. Discussed with son at length over the phone.  Jamie Norton M.D on 09/25/2014 at 10:39 AM  Between 7am to 6pm - Pager - [phone]  After 6pm go to [email or web address] - password [email or web address] [facility]  [facility]  Office  [phone]  CC: Primary care physician; Jamie Mannanalia Aron, MD

## 2014-09-25 NOTE — Clinical Social Work Note (Signed)
Clinical Social Worker is continuing to follow for discharge planning needs. Pt has a bed [facility]. MD is aware. CSW will continue to follow.   Dede QuerySarah Amani Marseille, MSW, LCSW Clinical Social Worker  [phone]

## 2014-09-25 NOTE — Plan of Care (Signed)
Problem: Discharge Progression Outcomes Goal: Discharge plan in place and appropriate Individualization  Outcome: Progressing Plan of care progress to goals: Pt goes by Ms. Lisette Grinderarlson. She is from [facility]. She has a hx of Atrial fibrillation, Dementia, Gastritis, Anemia, Depression and is on home medications Goal: Other Discharge Outcomes/Goals Outcome: Progressing Plan of care progress to goals: Afebrile. O2 sats>92% on 3L O2 per [place]. BP/HR stable. Denies pain. NPO since midnight for barium swallow study.

## 2014-09-25 NOTE — Progress Notes (Signed)
Notified Dr Anne HahnWillis that pt is scheduled for Barium swallow study, but no NPO order in place. MD verbalized that he will enter an order himself.

## 2014-09-26 ENCOUNTER — Inpatient Hospital Stay: Payer: Medicare Other

## 2014-09-26 DIAGNOSIS — Z79899 Other long term (current) drug therapy: Secondary | ICD-10-CM

## 2014-09-26 DIAGNOSIS — I4891 Unspecified atrial fibrillation: Secondary | ICD-10-CM

## 2014-09-26 DIAGNOSIS — Z515 Encounter for palliative care: Secondary | ICD-10-CM

## 2014-09-26 DIAGNOSIS — J690 Pneumonitis due to inhalation of food and vomit: Principal | ICD-10-CM

## 2014-09-26 LAB — PROTIME-INR
INR: 4.23
PROTHROMBIN TIME: 40.7 s — AB (ref 11.4–15.0)

## 2014-09-26 NOTE — Progress Notes (Signed)
Physical Therapy Treatment Patient Details Name: Jamie OrleansLetitia R Capps MRN: [id number] DOB: [birth year] Today's Date: 09/26/2014    History of Present Illness Pt is admitted for pneumonia. Pt with history of Afib, dementia, and anemia.     PT Comments    Pt is making gradual progress towards goals. Pt with increased O2 need with mobility. Pt on 4L at rest with sats at 90%, however increased to 6L with mobility with sats decreasing to 84%. Cues for pursed lip breathing at this time. Pt stoic this AM. Good endurance with there-ex.  Follow Up Recommendations  SNF     Equipment Recommendations       Recommendations for Other Services       Precautions / Restrictions Precautions Precautions: None Restrictions Weight Bearing Restrictions: No    Mobility  Bed Mobility Overal bed mobility: Needs Assistance Bed Mobility: Supine to Sit     Supine to sit: Min guard     General bed mobility comments: supine->sit with cga. Safe technique performed.  Transfers Overall transfer level: Needs assistance Equipment used: Rolling walker (2 wheeled) Transfers: Sit to/from Stand Sit to Stand: Min assist         General transfer comment: sit<>stand with rw and min assist. Pt with forward flexed posture, however requires cues for upright posture.  Ambulation/Gait Ambulation/Gait assistance: Min guard Ambulation Distance (Feet): 60 Feet Assistive device: Rolling walker (2 wheeled) Gait Pattern/deviations: Step-through pattern     General Gait Details: ambulated on 6L of O2 with sats at 84%. No SOB symptoms noted, however fatigues quickly requesting to return to room. Pt with incontinent episode during ambulation and ambulated to toliet.    Stairs            Wheelchair Mobility    Modified Rankin (Stroke Patients Only)       Balance                                    Cognition Arousal/Alertness: Awake/alert Behavior During Therapy: WFL for tasks  assessed/performed Overall Cognitive Status: Within Functional Limits for tasks assessed                      Exercises Other Exercises Other Exercises: Pt performed ther-ex including B LE ankle pumps, quad sets, SLRs, hip abd/add. All ther-ex performed x 10 reps with supervision for correct technique.    General Comments        Pertinent Vitals/Pain Pain Assessment: No/denies pain    Home Living                      Prior Function            PT Goals (current goals can now be found in the care plan section) Acute Rehab PT Goals Patient Stated Goal: to go back to [facility] PT Goal Formulation: With patient Time For Goal Achievement: 10/06/14 Potential to Achieve Goals: Good Progress towards PT goals: Progressing toward goals    Frequency  Min 2X/week    PT Plan Discharge plan needs to be updated    Co-evaluation             End of Session Equipment Utilized During Treatment: Oxygen Activity Tolerance: Patient tolerated treatment well Patient left: in chair;with chair alarm set     Time: [id number] PT Time Calculation (min) (ACUTE ONLY): 30 min  Charges:  $Gait Training: 8-22  mins $Therapeutic Exercise: 8-22 mins                    G Codes:      Inger Wiest 10-21-2014, 11:31 AM  Elizabeth Palau, PT, DPT [phone]

## 2014-09-26 NOTE — Progress Notes (Signed)
ANTICOAGULATION CONSULT NOTE - Follow Up Consult  Pharmacy Consult for warfarin Indication: atrial fibrillation  Allergies  Allergen Reactions  . Amoxicillin Itching  . Doxycycline Other (See Comments)    Reaction: Unknown  . Polytrim [Polymyxin B-Trimethoprim] Other (See Comments)    Reaction: Unknown  . Tolmetin Itching    Patient Measurements: Height: $RemoveBeforeDEI _LlyZzOhWSyGvhyPDXWHMjjVsRyKgjMme$5\' 2"culated) : 50.1   Vital Signs: Temp: 98.5 F (36.9 C) (05/31 0521) Temp Source: Oral (05/31 0521) BP: 176/93 mmHg (05/31 0838) Pulse Rate: 93 (05/31 0838)  Labs:  Recent Labs  09/24/14 0428 09/25/14 0424 09/26/14 0449  HGB 12.4  --   --   HCT 36.7  --   --   PLT 164  --   --   LABPROT 25.6* 31.1* 40.7*  INR 2.32 2.99 4.23*  CREATININE 0.63  --   --     Estimated Creatinine Clearance: 33.8 mL/min (by C-G formula based on Cr of 0.63).   Medications:  Scheduled:  . acidophilus  1 capsule Oral BID  . carvedilol  3.125 mg Oral BID WC  . donepezil  10 mg Oral QHS  . feeding supplement (ENSURE ENLIVE)  237 mL Oral BID BM  . furosemide  20 mg Oral Daily  . ipratropium-albuterol  3 mL Nebulization QID  . meropenem (MERREM) IV  1 g Intravenous Q12H  . pantoprazole  40 mg Oral Daily  . polyethylene glycol  17 g Oral Daily  . sertraline  50 mg Oral QHS  . vancomycin  750 mg Intravenous Q24H  . warfarin  2 mg Oral Once per day on Mon Wed Fri  . warfarin  3 mg Oral Once per day on Sun Tue Thu Sat  . Warfarin - Pharmacist Dosing Inpatient   Does not apply q1800    Assessment: Patient's INR elevated @ 4.23. Patient received 2 mg PO last night of warfarin. Goal of Therapy:  INR 2-3 Monitor platelets by anticoagulation protocol: Yes   Plan:  Will hold warfarin for today and will order PT/INR daily to monitor. Pharmacy to follow  Demetrius Charityeldrin D. Favian Kittleson, PharmD  09/26/2014,8:48 AM

## 2014-09-26 NOTE — Progress Notes (Signed)
[facility] - [place] at [facility]   PATIENT NAME: Jamie ManLetitia Norton    MR#:  [id number]  DATE OF BIRTH:  09-Aug-1921  SUBJECTIVE:  CHIEF COMPLAINT:   Chief Complaint  Patient presents with  . other     aspirated while eating pudding at lunch   -remains about the same, denies any active complaints other than dyspnea -remains hypoxic, requiring 4l o2 - INR elevated on coumadin  REVIEW OF SYSTEMS:  ROS Constitutional: Negative for fever and chills.  Eyes: Negative for blurred vision.  Respiratory: Positive for cough and shortness of breath. Negative for wheezing.  Cardiovascular: Negative for chest pain and palpitations.  Gastrointestinal: Negative for nausea, vomiting, abdominal pain, diarrhea and constipation.  Genitourinary: Negative for dysuria.  Neurological: Negative for dizziness, seizures and headaches.   DRUG ALLERGIES:   Allergies  Allergen Reactions  . Amoxicillin Itching  . Doxycycline Other (See Comments)    Reaction: Unknown  . Polytrim [Polymyxin B-Trimethoprim] Other (See Comments)    Reaction: Unknown  . Tolmetin Itching    VITALS:  Blood pressure 176/93, pulse 93, temperature 98.5 F (36.9 C), temperature source Oral, resp. rate 20, height 5\' 2"  (1.575 m), weight 45.813 kg (101 lb), SpO2 94 %.  PHYSICAL EXAMINATION:  Physical Exam  GENERAL:  81 y.o.-year-old patient lying in the bed with no acute distress.  EYES: Pupils equal, round, reactive to light and accommodation. No scleral icterus. Extraocular muscles intact.  HEENT: Head atraumatic, normocephalic. Oropharynx and nasopharynx clear.  NECK:  Supple, no jugular venous distention. No thyroid enlargement, no tenderness.  LUNGS:   Decreased bibasilar breath sounds. Diffuse crackles and rhonchi all over the lungs. Scant breath sounds bilaterally. No wheezing . Use of accessory muscles for breathing. CARDIOVASCULAR: S1, S2 normal. Systolic murmur is present, no rubs or  gallops.  ABDOMEN: Soft, nontender, nondistended. Bowel sounds present. No organomegaly or mass.  EXTREMITIES: No pedal edema, cyanosis, or clubbing.  NEUROLOGIC: Cranial nerves II through XII are intact. Muscle strength 5/5 in all extremities. Sensation intact. Gait not checked.  PSYCHIATRIC: The patient is alert and oriented x 3.  SKIN: No obvious rash, lesion, or ulcer.    LABORATORY PANEL:   CBC  Recent Labs Lab 09/24/14 0428  WBC 13.3*  HGB 12.4  HCT 36.7  PLT 164   ------------------------------------------------------------------------------------------------------------------  Chemistries   Recent Labs Lab 09/20/14 1618 09/20/14 2000  09/24/14 0428  NA 141  --   < > 137  K 3.0*  --   < > 4.1  CL 95*  --   < > 105  CO2 32  --   < > 28  GLUCOSE 184*  --   < > 104*  BUN 30*  --   < > 31*  CREATININE 0.91  --   < > 0.63  CALCIUM 9.5  --   < > 8.4*  MG  --  1.7  --   --   AST 73*  --   --   --   ALT 48  --   --   --   ALKPHOS 120  --   --   --   BILITOT 0.6  --   --   --   < > = values in this interval not displayed. ------------------------------------------------------------------------------------------------------------------  Cardiac Enzymes  Recent Labs Lab 09/20/14 1618  TROPONINI 0.03   ------------------------------------------------------------------------------------------------------------------  RADIOLOGY:  Dg Chest 2 View  09/26/2014   CLINICAL DATA:  Cough, congestion, congestion  EXAM:  CHEST  2 VIEW  COMPARISON:  09/23/2014  FINDINGS: Cardiomediastinal silhouette is stable. Persistent airspace opacification of left lung and probable small left pleural effusion. Patchy airspace opacification in right lower lobe. Findings highly suspicious for bilateral pneumonia or ARDS. Atherosclerotic calcifications of thoracic aorta.  IMPRESSION: Persistent airspace opacification of left lung and probable small left pleural effusion. Patchy airspace  opacification in right lower lobe. Findings highly suspicious for bilateral pneumonia or ARDS.   Electronically Signed   By: Jamie Norton M.D.   On: 09/26/2014 08:11   Dg Swallowing Func-speech Pathology  09/25/2014   Jamie Norton, CCC-SLP     09/25/2014  2:37 PM  Objective Swallowing Evaluation: Other (Comment) (MBSS)  Patient Details  Name: Jamie Norton MRN: [id number] Date of Birth: Apr 01, 1922  Today's Date: 09/25/2014 Time: SLP Start Time (ACUTE ONLY): 0815-SLP Stop Time (ACUTE  ONLY): 0920 SLP Time Calculation (min) (ACUTE ONLY): 65 min  Past Medical History:  Past Medical History  Diagnosis Date  . Atrial fibrillation   . Dementia   . Gastritis   . Anemia   . Depression    Past Surgical History: History reviewed. No pertinent past  surgical history. HPI:  Other Pertinent Information: pt continued to demo. inconsistency  w/ toleration of rec'd diet over the weekend thus the diet was  modified to a Dys. I w/ Nectar liquids. Noted a decline in CXR as  well. Discussion w/ MD also.   No Data Recorded  Assessment / Plan / Recommendation CHL IP CLINICAL IMPRESSIONS 09/25/2014  Therapy Diagnosis Moderate pharyngeal phase dysphagia  Clinical Impression Pt appears to present w/ moderate pharyngeal  phase dysphagia w/ mild oral phase dysphagia. During the oral  phase, noted min. delay in A-P transit and min. oral reside post  initial swallow. Pt was able to clear the majority w/ a f/u  swallow. During the pharyngeal phase of swallowing, pt exhibited  increased pharyngeal residue w/ all bolus consistencies resulting  in slight-min. laryngeal penetration/aspiration AFTER the  swallow. Pt was able to use the strategy of Throat Clearing and  expectoration w/ min. verbal cues and this appeared to clear the  laryngeal vestibule of bolus residue 3/3 trials. However, pt does  remain at increased risk for chronic aspiration of bolus reisdue.  Suspect pt's Esophageal phase presentation of slow motility and  clearing may be  impacting the pharyngeal phase thus increasing  risk for increased pharyngeal residue to remain and laryngeal  penetration/aspiration of such to occur.        CHL IP TREATMENT RECOMMENDATION 09/25/2014  Treatment Recommendations Therapy as outlined in treatment plan  below     CHL IP DIET RECOMMENDATION 09/25/2014  SLP Diet Recommendations Dysphagia 2 (Fine chop);Thin  Liquid Administration via (None)  Medication Administration Crushed with puree  Compensations Small sips/bites;Slow rate  Postural Changes and/or Swallow Maneuvers (None)   Diet Recommendation w/ Strategies/Precautions:  Dys. II w/ Thin  liquids for less mastication effort/time and for easier  Esophageal motility and clearing;  Throat Clear strategy every ~2  bites/sips to attempt to clear laryngeal vestibule of any  penetrated pharyngeal residue;  Alternating bites of food w/  liquids - small, single bites and sips.  Time b/t each for f/u  swallows to reduce pharyngeal residue;  Moisten foods well;   Small, more frequent meals/snacks;  Nutritional supplements -  drink, per Dietician.     CHL IP OTHER RECOMMENDATIONS 09/25/2014  Recommended Consults (No Data)  Oral Care Recommendations Oral  care BID;Oral care before and  after PO;Staff/trained caregiver to provide oral care  Other Recommendations Other (Comment)     No flowsheet data found.   CHL IP FREQUENCY AND DURATION 09/25/2014  Speech Therapy Frequency (ACUTE ONLY) min 3x week  Treatment Duration 1 week     Pertinent Vitals/Pain None indicated    SLP Swallow Goals See care plan  No flowsheet data found.    CHL IP REASON FOR REFERRAL 09/25/2014  Reason for Referral Objectively evaluate swallowing function     CHL IP ORAL PHASE 09/25/2014  Lips (None)  Tongue (None)  Mucous membranes (None)  Nutritional status (None)  Other (None)  Oxygen therapy (None)  Oral Phase Impaired  Oral - Pudding Teaspoon (None)  Oral - Pudding Cup (None)  Oral - Honey Teaspoon (None)  Oral - Honey Cup (None)  Oral - Honey  Syringe (None)  Oral - Nectar Teaspoon (None)  Oral - Nectar Cup (None)  Oral - Nectar Straw (None)  Oral - Nectar Syringe (None)  Oral - Ice Chips (None)  Oral - Thin Teaspoon (None)  Oral - Thin Cup (None)  Oral - Thin Straw (None)  Oral - Thin Syringe (None)  Oral - Puree (None)  Oral - Mechanical Soft (None)  Oral - Regular (None) Oral - Multi-consistency (None)  Oral - Pill (None)  Oral Phase - Comment (None) Pt exhibited delayed A-P transit and  min. Oral residue post trials. She was able to clear the oral  residue w/ a f/u swallow and lingual sweep independently.      CHL IP PHARYNGEAL PHASE 09/25/2014  Pharyngeal Phase Impaired  Pharyngeal - Pudding Teaspoon (None)  Penetration/Aspiration details (pudding teaspoon) (None)  Pharyngeal - Pudding Cup (None) Penetration/Aspiration details  (pudding cup) (None)  Pharyngeal - Honey Teaspoon (None)  Penetration/Aspiration details (honey teaspoon) (None)  Pharyngeal - Honey Cup (None)  Penetration/Aspiration details (honey cup) (None)  Pharyngeal - Honey Syringe (None)  Penetration/Aspiration details (honey syringe) (None)  Pharyngeal - Nectar Teaspoon (None)  Penetration/Aspiration details (nectar teaspoon) (None)  Pharyngeal - Nectar Cup (None)  Penetration/Aspiration details (nectar cup) (None)  Pharyngeal - Nectar Straw (None)  Penetration/Aspiration details (nectar straw) (None)  Pharyngeal - Nectar Syringe (None)  Penetration/Aspiration details (nectar syringe) (None)  Pharyngeal - Ice Chips (None)  Penetration/Aspiration details (ice chips) (None)  Pharyngeal - Thin Teaspoon (None)  Penetration/Aspiration details (thin teaspoon) (None)  Pharyngeal - Thin Cup (None)  Penetration/Aspiration details (thin cup) (None)  Pharyngeal - Thin Straw (None)  Penetration/Aspiration details (thin straw) (None)  Pharyngeal - Thin Syringe (None)  Penetration/Aspiration details (thin syringe') (None)  Pharyngeal - Puree (None)  Penetration/Aspiration details (puree) (None)   Pharyngeal - Mechanical Soft (None)  Penetration/Aspiration details (mechanical soft) (None)  Pharyngeal - Regular (None)  Penetration/Aspiration details (regular) (None)  Pharyngeal - Multi-consistency (None)  Penetration/Aspiration details (multi-consistency) (None)  Pharyngeal - Pill (None)  Penetration/Aspiration details (pill) (None)  Pharyngeal Comment (None)  Pt presented w/ mild-mod. Pharyngeal residue w/ all consistencies  indicating reduce pharyngeal pressure and laryngeal  excursion/movement during the swallowing. This resulted in min.+  laryngeal penetration and aspiration(slight x1) w/ consistencies.  Pt was able to use a Throat Clear strategy to reduce/clear the  bolus residue in the laryngeal vestibule.     CHL IP CERVICAL ESOPHAGEAL PHASE 09/25/2014  Cervical Esophageal Phase Impaired  Pudding Teaspoon (None)  Pudding Cup (None)  Honey Teaspoon (None)  Honey Cup (None)  Honey Straw (None)  Nectar Teaspoon (None)  Nectar  Cup (None)  Nectar Straw (None)  Nectar Sippy Cup (None)  Thin Teaspoon (None)  Thin Cup (None)  Thin Straw (None)  Thin Sippy Cup (None)  Cervical Esophageal Comment This reduced motility in the  Esophagus could be impacting the motility and cleraing of the  pharynx during the swallowing thus increasing pharyngeal residue  increasing risk for the laryngeal penetration/aspiration to  occur.    No flowsheet data found.         Watson,Katherine 09/25/2014, 2:24 PM     EKG:  No orders found for this or any previous visit.  ASSESSMENT AND PLAN:   82 year old elderly female with past medical history significant for atrial fibrillation on Coumadin, dementia, and anemia presents from home secondary to possible aspiration.  #1 acute hypoxic respiratory failure-secondary to aspiration pneumonia-presenting with acute respiratory distress, tachypnea and hypoxia.  - O2 requirements are improving- now at 4l again. - Encourage flutter valve.  On vancomycin and meropenem.  - repeat CXR  with same findings. Discontinue vancomycin today.cont meropenem. - Continue Combivent for her COPD changes on chest x-ray.  - Speech therapy evaluation is appreciated.  - Low-dose Lasix has been added. Also do a nebs when necessary.  #2 Dysphagia- appreciate speech therapist consult. Pt had modified barium swallow study done and did not do so well. Discussed about PEG tube vs strategies/techniques to son and patient- - until then diet per speech recommendations - likely has been having chronic aspiration - palliative care consulted today  #3 atrial fibrillation-rate better controlled after adding Coreg.  - On Coumadin for anticoagulation. INR is supra-therapeutic now. - discontinue coumadin for now, repeat INR tomorrow  #4 history of gastritis-continue Protonix.  Physical therapy evaluation, likely discharge to the rehabilitation part of [facility] whenever her respiratory status improves.  All the records are reviewed and case discussed with Care Management/Social Workerr. Management plans discussed with the patient, family and they are in agreement.  CODE STATUS: DNR  TOTAL TIME TAKING CARE OF THIS PATIENT: 40 minutes.   POSSIBLE D/C 2-3 DAYS, DEPENDING ON CLINICAL CONDITION.   Enid Baas M.D on 09/26/2014 at 10:38 AM  Between 7am to 6pm - Pager - [phone]  After 6pm go to [email or web address] - password [email or web address] [facility]  [facility]  Office  [phone]  CC: Primary care physician; Ruthe Mannan, MD

## 2014-09-26 NOTE — Consult Note (Signed)
I met with pt in the presence of her son, Tyerra Loretto, who is pt's HCPOA. We discussed the options of continued po using the recommendations from ST vs PEG. Explained that PEG would not eliminate the risk of aspiration. Pt says that she wants to continue eating with careful attention to avoid aspiration and son agrees. They both understand that pt remains at risk for aspiration.   Probable d/c tomorrow to [facility] for STR with the ultimate goal of returning to ALF at [facility].

## 2014-09-26 NOTE — Consult Note (Signed)
Palliative Medicine Inpatient Consult Note   Name: Jamie OrleansLetitia R Norton Date: 09/26/2014 MRN: [id number]  DOB: [birth year]  Referring Physician: Enid Baasadhika Kalisetti, MD  Palliative Care consult requested for this 81 y.o. female for goals of medical therapy in patient with dysphasia admitted with aspiration pneumonia  Jamie Norton is a 81 yo woman with PMH of a.fib, anemia, gastritis, dementia, depression. She was admitted 09/20/14 with LLL pneumonia presumed secondary to aspiration. Pt has improved slightly since admission but is still requiring 3L 02 and follow up CXR's show multilobar pneumonia. ST has seen pt with recommendations for diet. At present, pt is sitting up in chair. Awake, answers questions appropriately. Family not present.    REVIEW OF SYSTEMS:  Pain: None Dyspnea:  Yes and not at rest Nausea/Vomiting:  No Diarrhea:  No Constipation:   No Depression:   No Anxiety:   No Fatigue:   Yes   SOCIAL HISTORY:Pt is widowed. [place]. She lives [facility]. She has a son, Jamie HillDonald, who lives locally. She has a son in [place] and a son in [place].  reports that she has never smoked. She does not have any smokeless tobacco history on file. She reports that she does not drink alcohol or use illicit drugs.  LEGAL DOCUMENTS:  Health Care Power of Attorney:  Yes.  CODE STATUS: DNR  PAST MEDICAL HISTORY: Past Medical History  Diagnosis Date  . Atrial fibrillation   . Dementia   . Gastritis   . Anemia   . Depression     PAST SURGICAL HISTORY: History reviewed. No pertinent past surgical history.  ALLERGIES:  is allergic to amoxicillin; doxycycline; polytrim; and tolmetin.  MEDICATIONS:  Current Facility-Administered Medications  Medication Dose Route Frequency Provider Last Rate Last Dose  . acidophilus (RISAQUAD) capsule 1 capsule  1 capsule Oral BID Enid Baasadhika Kalisetti, MD   1 capsule at 09/26/14 0844  . carvedilol (COREG) tablet 3.125 mg   3.125 mg Oral BID WC Enid Baasadhika Kalisetti, MD   3.125 mg at 09/26/14 0843  . donepezil (ARICEPT) tablet 10 mg  10 mg Oral QHS Shaune PollackQing Chen, MD   10 mg at 09/25/14 2033  . feeding supplement (ENSURE ENLIVE) (ENSURE ENLIVE) liquid 237 mL  237 mL Oral BID BM Enid Baasadhika Kalisetti, MD   237 mL at 09/26/14 0849  . furosemide (LASIX) tablet 20 mg  20 mg Oral Daily Enid Baasadhika Kalisetti, MD   20 mg at 09/26/14 0843  . gabapentin (NEURONTIN) capsule 100 mg  100 mg Oral TID PRN Shaune PollackQing Chen, MD      . ipratropium-albuterol (DUONEB) 0.5-2.5 (3) MG/3ML nebulizer solution 3 mL  3 mL Nebulization QID Enid Baasadhika Kalisetti, MD   3 mL at 09/26/14 0801  . meropenem (MERREM) 1 g in sodium chloride 0.9 % 100 mL IVPB  1 g Intravenous Q12H Enid Baasadhika Kalisetti, MD   1 g at 09/25/14 2102  . pantoprazole (PROTONIX) EC tablet 40 mg  40 mg Oral Daily Shaune PollackQing Chen, MD   40 mg at 09/26/14 0843  . polyethylene glycol (MIRALAX / GLYCOLAX) packet 17 g  17 g Oral Daily Gale Journeyatherine P Walsh, MD   17 g at 09/26/14 [phone]  . sertraline (ZOLOFT) tablet 50 mg  50 mg Oral QHS Shaune PollackQing Chen, MD   50 mg at 09/25/14 2033  . Warfarin - Pharmacist Dosing Inpatient   Does not apply [id number] Enid Baasadhika Kalisetti, MD        Vital Signs: BP  176/93 mmHg  Pulse 93  Temp(Src) 98.5 F (36.9 C) (Oral)  Resp 20  Ht  (1.575 m)  Wt 45.813 kg (101 lb)  BMI 18.47 kg/m2  SpO2 94% Filed Weights   09/20/14 1548 09/20/14 1942  Weight: 45.36 kg (100 lb) 45.813 kg (101 lb)    Estimated body mass index is 18.47 kg/(m^2) as calculated from the following:   Height as of this encounter:  (1.575 m).   Weight as of this encounter: 45.813 kg (101 lb).  PERFORMANCE STATUS (ECOG) : 2 - Symptomatic, <50% confined to bed  PHYSICAL EXAM:  General: elderly, ill-appearing HEENT: OP clear, hearing intact Neck: Trachea midline  Cardiovascular: irregular rate and rhythm Pulmonary/Chest: Clear ant fields Abdominal: Soft, NTTP, + bowel sounds GU: No SP tenderness Extremities: trace  edema  Neurological: moves extremities, follows commands Skin: no rashes  Psychiatric: alert, oriented to person, place, situation, knows children's name  LABS: CBC:  Recent Labs Lab 09/20/14 1618 09/24/14 0428  WBC 14.8* 13.3*  HGB 15.1 12.4  HCT 45.9 36.7  PLT 222 164   Comprehensive Metabolic Panel:  Recent Labs Lab 09/20/14 1618 09/20/14 2000 09/22/14 0609 09/24/14 0428  NA 141  --  138 137  K 3.0*  --  4.9 4.1  CL 95*  --  106 105  CO2 32  --  27 28  GLUCOSE 184*  --  150* 104*  BUN 30*  --  27* 31*  CREATININE 0.91  --  0.72 0.63  CALCIUM 9.5  --  8.5* 8.4*  MG  --  1.7  --   --   AST 73*  --   --   --   ALT 48  --   --   --   ALKPHOS 120  --   --   --   BILITOT 0.6  --   --   --     IMPRESSION:  Jamie Norton is a 81 yo woman with PMH of a.fib, anemia, gastritis, dementia, depression. She was admitted 09/20/14 with LLL pneumonia presumed secondary to aspiration. Pt has improved slightly since admission but is still requiring 3L 02 and follow up CXR's show multilobar pneumonia. ST has seen pt with recommendations for diet.   I spoke with pt. She is aware that she is in the hospital and that she is being tx'd for pneumonia. She says that she has had swallowing difficulties "for a while". She says that she would not want a feeding tube but she would agree "it I had to".   I spoke with son, Jamie Norton, by phone. He will be at hospital this afternoon and I will meet with him at that time to further address goals of therapy.   PLAN: Meet with son   More than 50% of the visit was spent in counseling/coordination of care: YES  Time spent: 75 minutes

## 2014-09-26 NOTE — Progress Notes (Signed)
   09/26/14 1100  Clinical Encounter Type  Visited With Patient  Visit Type Initial  Pt was calm and quiet during the visit.  Appeared accepting.  I asked her to let me know if she needed anything.  Patient thanked me for coming to see her. [employer] Autom[employer] GroupChaplain Raykwon Hobbs-pager [phone]

## 2014-09-26 NOTE — Plan of Care (Signed)
Problem: Discharge Progression Outcomes Goal: Discharge plan in place and appropriate Individualization  Outcome: Progressing  Individualization of Care:   Patient prefers to be called Jamie Norton, from [facility].    PMH: A. Fib., Dementia, Gastritis, Anemia, Depression - controlled with medications Goal: Other Discharge Outcomes/Goals Outcome: Progressing Pt alert and oriented, does not complain of pain or discomfort, getting up to bsc with 1 assist, but has an unsteady gait. Swallows meds whole with water with no issues. O2 sats 96% on 3L. Will continue to ween.

## 2014-09-26 NOTE — Clinical Social Work Note (Signed)
CSW updated admissions coordinator at [facility], and a bed is still available for placement at discharge. Pt is not ready for discharge. CSW will continue to follow.   Dede QuerySarah Treanna Dumler, MSW, LCSW Clinical Social Worker [phone]

## 2014-09-26 NOTE — Plan of Care (Signed)
Problem: Discharge Progression Outcomes Goal: Other Discharge Outcomes/Goals Outcome: Progressing Individualization   Outcome: Progressing  Individualization of Care:   Patient prefers to be called [name], from [facility].     PMH: A. Fib., Dementia, Gastritis, Anemia, Depression - controlled with medications Goal: Other Discharge Outcomes/Goals Outcome: Progressing Pt alert and oriented, does not complain of pain or discomfort, getting up to bsc with 1 assist, but has an unsteady gait. Swallows meds crushes in applesauce today.2 sats 96% on 3L. Will continue to ween. Unable to today  pts sats cont to drop in low 80s so kept on 3l [place]. 2 bms today  Soft yellow stool   Mod amt.pallative care saw today. cxr this am

## 2014-09-26 NOTE — Progress Notes (Signed)
Speech Language Pathology Treatment: Dysphagia  Patient Details Name: Jamie OrleansLetitia R Bontrager MRN: [id number] DOB: 1922-01-24 Today's Date: 09/26/2014 Time: [id number] SLP Time Calculation (min) (ACUTE ONLY): 43 min  Assessment / Plan / Recommendation Clinical Impression  Pt appears to present w/ inconsistent, overt s/s of aspiration during oral intake c/b throat clearing and wet vocal quality despite following aspiration precautions. Pt was given continued education on using strategy of throat clearing every 1-2 swallows of food/liquid and expectorate as nec. to reduce/clear any potential laryngeal penetration/aspiration as able. Pt acknowledges she understands following the aspiration precautions but appears to benefit from verbal cues and monitoring during meals. Reviewed chart notes and noted Palliative Care following discussing poc w/ Son. Pt appears to present w/ increased risk for aspiration w/ po intake at this time.    HPI Other Pertinent Information: pt has been continuing an oral diet w/ aspiration precautions and strategies as posted. Pt acknowledged to MD she has had "swallowing problems for a while". She continues to present w/ intermittent s/s of aspiration overtly despite precautions.    Pertinent Vitals Pain Assessment: No/denies pain  SLP Plan  Continue with current plan of care;Consult other service (comment) (Palliative Care following and meeting w/ Son re: poc)    Recommendations Diet recommendations: Dysphagia 2 (fine chop);Thin liquid Liquids provided via: Cup Medication Administration: Crushed with puree Supervision: Patient able to self feed;Intermittent supervision to cue for compensatory strategies Compensations: Slow rate;Small sips/bites;Clear throat intermittently Postural Changes and/or Swallow Maneuvers: Seated upright 90 degrees              Oral Care Recommendations: Oral care BID;Oral care before and after PO;Staff/trained caregiver to provide oral care Follow  up Recommendations: Skilled Nursing facility Plan: Continue with current plan of care;Consult other service (comment) (Palliative Care following and meeting w/ Son re: poc)    GO     Emilia Kayes 09/26/2014, 3:56 PM

## 2014-09-27 LAB — BASIC METABOLIC PANEL
Anion gap: 5 (ref 5–15)
BUN: 27 mg/dL — ABNORMAL HIGH (ref 6–20)
CO2: 31 mmol/L (ref 22–32)
CREATININE: 0.49 mg/dL (ref 0.44–1.00)
Calcium: 8.1 mg/dL — ABNORMAL LOW (ref 8.9–10.3)
Chloride: 102 mmol/L (ref 101–111)
GFR calc non Af Amer: >60 mL/min (ref >60)
Glucose, Bld: 93 mg/dL (ref 65–99)
Potassium: 4.1 mmol/L (ref 3.5–5.1)
Sodium: 138 mmol/L (ref 135–145)

## 2014-09-27 LAB — MISC LABCORP TEST (SEND OUT): Labcorp test code: 182246

## 2014-09-27 LAB — HEMOGLOBIN: Hemoglobin: 12 g/dL (ref 12.0–16.0)

## 2014-09-27 LAB — PROTIME-INR
INR: 3.34
PROTHROMBIN TIME: 33.9 s — AB (ref 11.4–15.0)

## 2014-09-27 MED ORDER — WARFARIN SODIUM 2 MG PO TABS
1.0000 mg | ORAL_TABLET | Freq: Every day | ORAL | Status: DC
Start: 2014-09-27 — End: 2014-09-27

## 2014-09-27 MED ORDER — LEVOFLOXACIN 500 MG PO TABS: 500.0000 mg | ORAL_TABLET | Freq: Every day | ORAL | Status: AC

## 2014-09-27 MED ORDER — IPRATROPIUM-ALBUTEROL 0.5-2.5 (3) MG/3ML IN SOLN: 3.0000 mL | Freq: Four times a day (QID) | RESPIRATORY_TRACT | Status: AC | PRN

## 2014-09-27 MED ORDER — IPRATROPIUM-ALBUTEROL 20-100 MCG/ACT IN AERS: 1.0000 | INHALATION_SPRAY | Freq: Four times a day (QID) | RESPIRATORY_TRACT | Status: AC

## 2014-09-27 MED ORDER — FUROSEMIDE 20 MG PO TABS: 20.0000 mg | ORAL_TABLET | Freq: Every day | ORAL | Status: AC

## 2014-09-27 MED ORDER — WARFARIN SODIUM 2 MG PO TABS: 2.0000 mg | ORAL_TABLET | Freq: Every day | ORAL | Status: AC

## 2014-09-27 NOTE — Progress Notes (Signed)
Report called to [facility]-no distress noted

## 2014-09-27 NOTE — Progress Notes (Addendum)
ANTICOAGULATION CONSULT NOTE - Follow Up Consult  Pharmacy Consult for warfarin Indication: atrial fibrillation  Allergies  Allergen Reactions  . Amoxicillin Itching  . Doxycycline Other (See Comments)    Reaction: Unknown  . Polytrim [Polymyxin B-Trimethoprim] Other (See Comments)    Reaction: Unknown  . Tolmetin Itching    Patient Measurements: Height: 5\' 2"  (157.5 cm) Weight: 101 lb (45.813 kg) IBW/kg (Calculated) : 50.1   Vital Signs: Temp: 99.2 F (37.3 C) (06/01 0534) Temp Source: Oral (06/01 0534) BP: 153/72 mmHg (06/01 0534) Pulse Rate: 88 (06/01 0534)  Labs:  Recent Labs  09/25/14 0424 09/26/14 0449 09/27/14 0605  HGB  --   --  12.0  LABPROT 31.1* 40.7* 33.9*  INR 2.99 4.23* 3.34  CREATININE  --   --  0.49    Estimated Creatinine Clearance: 33.8 mL/min (by C-G formula based on Cr of 0.49).   Medications:  Scheduled:  . acidophilus  1 capsule Oral BID  . carvedilol  3.125 mg Oral BID WC  . donepezil  10 mg Oral QHS  . feeding supplement (ENSURE ENLIVE)  237 mL Oral BID BM  . furosemide  20 mg Oral Daily  . ipratropium-albuterol  3 mL Nebulization QID  . meropenem (MERREM) IV  1 g Intravenous Q12H  . pantoprazole  40 mg Oral Daily  . polyethylene glycol  17 g Oral Daily  . sertraline  50 mg Oral QHS  . warfarin  1 mg Oral q1800  . Warfarin - Pharmacist Dosing Inpatient   Does not apply q1800    Assessment: Patient's INR slightly elevated @ 3.34 but down from 4.23 after a day holding.  Goal of Therapy:  INR 2-3 Monitor platelets by anticoagulation protocol: Yes   Plan:  Will give a low dose of 1 mg of warfarin to prevent INR from dropping significantly rather than holding. INR likely to be within goal range tomorrow due to full effect of held dose on 5/31 impacting 6/2's INR.    Spoke with MD Nemiah CommanderKalisetti and she would prefer to hold warfarin today. Will hold warfarin.   Demetrius Charityeldrin D. Kamden Reber, PharmD  09/27/2014,9:13 AM

## 2014-09-27 NOTE — Progress Notes (Signed)
[facility] [place] at [facility]   PATIENT NAME: Jamie Norton    MR#:  [id number]  DATE OF BIRTH:  [birth year]  SUBJECTIVE:  CHIEF COMPLAINT:   Chief Complaint  Patient presents with  . other     aspirated while eating pudding at lunch   -feels some better, on 3l o2 - breathing some improved - decided against PEG tube - for discharge today   REVIEW OF SYSTEMS:  ROS Constitutional: Negative for fever and chills.  Eyes: Negative for blurred vision.  Respiratory: Positive for cough and shortness of breath. Negative for wheezing.  Cardiovascular: Negative for chest pain and palpitations.  Gastrointestinal: Negative for nausea, vomiting, abdominal pain, diarrhea and constipation.  Genitourinary: Negative for dysuria.  Neurological: Negative for dizziness, seizures and headaches.   DRUG ALLERGIES:   Allergies  Allergen Reactions  . Amoxicillin Itching  . Doxycycline Other (See Comments)    Reaction: Unknown  . Polytrim [Polymyxin B-Trimethoprim] Other (See Comments)    Reaction: Unknown  . Tolmetin Itching    VITALS:  Blood pressure 153/72, pulse 88, temperature 99.2 F (37.3 C), temperature source Oral, resp. rate 18, height  (1.575 m), weight 45.813 kg (101 lb), SpO2 92 %.  PHYSICAL EXAMINATION:  Physical Exam  GENERAL:  81 y.o.-year-old patient lying in the bed with no acute distress.  EYES: Pupils equal, round, reactive to light and accommodation. No scleral icterus. Extraocular muscles intact.  HEENT: Head atraumatic, normocephalic. Oropharynx and nasopharynx clear.  NECK:  Supple, no jugular venous distention. No thyroid enlargement, no tenderness.  LUNGS:   Decreased bibasilar breath sounds. Diffuse crackles and rhonchi all over the lungs. Scant breath sounds bilaterally. No wheezing . Use of accessory muscles for breathing. CARDIOVASCULAR: S1, S2 normal. Systolic murmur is present, no rubs or gallops.  ABDOMEN: Soft,  nontender, nondistended. Bowel sounds present. No organomegaly or mass.  EXTREMITIES: No pedal edema, cyanosis, or clubbing.  NEUROLOGIC: Cranial nerves II through XII are intact. Muscle strength 5/5 in all extremities. Sensation intact. Gait not checked.  PSYCHIATRIC: The patient is alert and oriented x 3.  SKIN: No obvious rash, lesion, or ulcer.    LABORATORY PANEL:   CBC  Recent Labs Lab 09/24/14 0428 09/27/14 0605  WBC 13.3*  --   HGB 12.4 12.0  HCT 36.7  --   PLT 164  --    ------------------------------------------------------------------------------------------------------------------  Chemistries   Recent Labs Lab 09/20/14 1618 09/20/14 2000  09/27/14 0605  NA 141  --   < > 138  K 3.0*  --   < > 4.1  CL 95*  --   < > 102  CO2 32  --   < > 31  GLUCOSE 184*  --   < > 93  BUN 30*  --   < > 27*  CREATININE 0.91  --   < > 0.49  CALCIUM 9.5  --   < > 8.1*  MG  --  1.7  --   --   AST 73*  --   --   --   ALT 48  --   --   --   ALKPHOS 120  --   --   --   BILITOT 0.6  --   --   --   < > = values in this interval not displayed. ------------------------------------------------------------------------------------------------------------------  Cardiac Enzymes  Recent Labs Lab 09/20/14 1618  TROPONINI 0.03   ------------------------------------------------------------------------------------------------------------------  RADIOLOGY:  Dg Chest 2 View  09/26/2014   CLINICAL DATA:  Cough, congestion, congestion  EXAM: CHEST  2 VIEW  COMPARISON:  09/23/2014  FINDINGS: Cardiomediastinal silhouette is stable. Persistent airspace opacification of left lung and probable small left pleural effusion. Patchy airspace opacification in right lower lobe. Findings highly suspicious for bilateral pneumonia or ARDS. Atherosclerotic calcifications of thoracic aorta.  IMPRESSION: Persistent airspace opacification of left lung and probable small left pleural effusion. Patchy airspace  opacification in right lower lobe. Findings highly suspicious for bilateral pneumonia or ARDS.   Electronically Signed   By: Natasha MeadLiviu  Pop M.D.   On: 09/26/2014 08:11    EKG:  No orders found for this or any previous visit.  ASSESSMENT AND PLAN:   81 year old elderly female with past medical history significant for atrial fibrillation on Coumadin, dementia, and anemia presents from home secondary to possible aspiration.  #1 acute hypoxic respiratory failure-secondary to aspiration pneumonia-presenting with acute respiratory distress, tachypnea and hypoxia.  - O2 requirements are improving- now at 3l. Chronic aspiration- likely will need o2 for long term - wean as tolerated - Encourage flutter valve.   - repeat CXR with same findings. on meropenem.- change to augmentin at discharge - Continue Combivent for her COPD changes on chest x-ray.  - Speech therapy evaluation is appreciated.  - Low-dose Lasix has been added. Also do a nebs when necessary.  #2 Dysphagia- appreciate speech therapist consult. Pt had modified barium swallow study done and did not do so well. Discussed about PEG tube vs strategies/techniques to son and patient-refused PEG at this time. - until then diet per speech recommendations - likely has been having chronic aspiration - Appreciate palliative care consult  #3 atrial fibrillation-rate better controlled after adding Coreg.  - On Coumadin for anticoagulation. INR is supra-therapeutic now. - restart at a lower dose from tomorrow  #4 history of gastritis-continue Protonix.  Physical therapy evaluation, likely [facility]  All the records are reviewed and case discussed with Care Management/Social Workerr. Management plans discussed with the patient, family and they are in agreement.  CODE STATUS: DNR  TOTAL TIME TAKING CARE OF THIS PATIENT: 40 minutes.   POSSIBLE D/CTODAY TO SNF, DEPENDING ON CLINICAL  CONDITION.   Enid BaasKALISETTI,Crystall Donaldson M.D on 09/27/2014 at 1:47 PM  Between 7am to 6pm - Pager - [phone]  After 6pm go to [email or web address] - password [email or web address] [facility]  [facility]  Office  [phone]  CC: Primary care physician; Ruthe Mannanalia Aron, MD

## 2014-09-27 NOTE — Plan of Care (Signed)
Problem: Discharge Progression Outcomes Goal: Discharge plan in place and appropriate Individualization  Patient prefers to be called Jamie Norton, from [facility].      Hx: A. Fib., Dementia, Gastritis, Anemia, Depression - controlled with medications High fall risk. Room near nurses station. Bed alarm on. Toileting offered at hourly rounds.    Goal: Other Discharge Outcomes/Goals Plan of care progress to goals: 1. O2 sats stable. Remains on 3L [place] 2. Hemodynamically:              -vitals stable, afebrile.             -INR 4.23, coumadin held 5/31 which is given for A. Fib              -IV Abx given as ordered  3. Complications: pt able to take pills crushed in thickened applesauce sitting up in bed and swallowing between bites. 4. Activity: +[facility] walker  No fall/injury this shift. Will continue to assess.

## 2014-09-27 NOTE — Clinical Social Work Note (Signed)
Pt is ready for discharge today to [facility]. Pt and son are agreeable to discharge plan. Facility is ready to admit pt as they has received dscharge summary. RN is calling report. EMS will provide transportation. CSW will continue to follow.   Dede QuerySarah Mirian Casco, MSW, LCSW Clinical Social Worker  [phone]

## 2014-09-27 NOTE — Progress Notes (Signed)
Physical Therapy Treatment Patient Details Name: Jamie Norton MRN: [id number] DOB: [birth year] Today's Date: 09/27/2014    History of Present Illness Pt is admitted for pneumonia. Pt with history of Afib, dementia, and anemia.     PT Comments    Pt is making good progress towards goals with increased ambulation distance this session. Improved O2 sats with exertion, however still requires increased O2 secondary to low O2 sats. Further gait distance limited secondary to fatigue. Cues given for pursed lip breathing. Good endurance with there-ex.   Follow Up Recommendations  SNF     Equipment Recommendations       Recommendations for Other Services       Precautions / Restrictions Precautions Precautions: None Restrictions Weight Bearing Restrictions: No    Mobility  Bed Mobility Overal bed mobility: Needs Assistance Bed Mobility: Supine to Sit     Supine to sit: Min guard     General bed mobility comments: supine->sit with cga. Safe technique performed.  Transfers Overall transfer level: Needs assistance Equipment used: Rolling walker (2 wheeled) Transfers: Sit to/from Stand Sit to Stand: Min assist         General transfer comment: sit<>stand with rw and min assist. Pt with forward flexed posture, however requires cues for upright posture.  Ambulation/Gait Ambulation/Gait assistance: Min guard Ambulation Distance (Feet): 80 Feet Assistive device: Rolling walker (2 wheeled)       General Gait Details: ambulated on 4L of O2 with sats at 87%. Pt complains of slight SOB with exertion, however able to tolerate increased distance with reciprocal gait pattern. Heavy cues required for safety and keeping rw close to body.   Stairs            Wheelchair Mobility    Modified Rankin (Stroke Patients Only)       Balance                                    Cognition Arousal/Alertness: Awake/alert Behavior During Therapy: WFL for tasks  assessed/performed Overall Cognitive Status: Within Functional Limits for tasks assessed                      Exercises Other Exercises Other Exercises: supine ther-ex performed including 12 reps of B UE shoulder flexion, scap squeezes, and resisted elbow flexion. Pt then performed B LE SLRs, hip abd/add, SAQ, and heel slides. All ther-ex performed with min assist and cues for sequencing. 2 rest breaks required secondary to fatigue.    General Comments        Pertinent Vitals/Pain Pain Assessment: No/denies pain    Home Living                      Prior Function            PT Goals (current goals can now be found in the care plan section) Acute Rehab PT Goals Patient Stated Goal: to go back to [facility] PT Goal Formulation: With patient Time For Goal Achievement: 10/06/14 Potential to Achieve Goals: Good Progress towards PT goals: Progressing toward goals    Frequency  Min 2X/week    PT Plan Current plan remains appropriate    Co-evaluation             End of Session Equipment Utilized During Treatment: Oxygen;Gait belt Activity Tolerance: Patient tolerated treatment well Patient left: in bed;with bed alarm set  Time: [id number] PT Time Calculation (min) (ACUTE ONLY): 23 min  Charges:  $Gait Training: 8-22 mins $Therapeutic Exercise: 8-22 mins                    G Codes:      Levan Aloia 09/27/2014, 11:50 AM  Elizabeth PalauStephanie Safire Gordin, PT, DPT [phone]

## 2014-09-27 NOTE — Progress Notes (Signed)
Nutrition Follow-up  DOCUMENTATION CODES:     INTERVENTION: Meals and Snacks: Cater to patient preferences within dietary restrictions per SLP Medical Food Supplement Therapy: continue Ensure Enlive (each supplement provides 350kcal and 20 grams of protein) BID as ordered  NUTRITION DIAGNOSIS:  Swallowing difficulty related to dysphagia as evidenced by  (aspiration pna, SLP following, current need for dysphagia III diet order)  GOAL:  Patient will meet greater than or equal to 90% of their needs; ongoing  MONITOR:   (Energy Intake, Digestive system, Anthropometrics)  REASON FOR ASSESSMENT:   (Follow-Up)    ASSESSMENT:  Per MD note, pt does not want PEG, will continue on oral diet with SLP recommendations.  PO Intake: pt reports eating breakfast this am eating corn flakes. Ensure had just been delivered on visit. RD opened however pt did not want to drink this am. Per I and O chart, pt ate 100% of lunch yesterday.  Medications: Protonix, Coumadin, Lasix Labs: Electrolyte and Renal Profile:  Recent Labs Lab 09/20/14 2000 09/22/14 0609 09/24/14 0428 09/27/14 0605  BUN  --  27* 31* 27*  CREATININE  --  0.72 0.63 0.49  NA  --  138 137 138  K  --  4.9 4.1 4.1  MG 1.7  --   --   --    [facility]   09/20/14 1548 09/20/14 1942  Weight: 100 lb (45.36 kg) 101 lb (45.813 kg)    BMI:  Body mass index is 18.47 kg/(m^2).  Estimated Nutritional Needs:  Kcal:  1152-1362kcals, BEE: 873kcals, TEE: (IF 1.1-1.3)(AF 1.2) using IBW of 50kg  Protein:  50-60g protein (1.0-1.2g/kg) using IBW of 50kg  Fluid:  1250-151000mL of fluid (25-6830mL/kg) using IBW of 50kg  Skin:  Reviewed, no issues  Diet Order:  DIET DYS 3 Diet - low sodium heart healthy DIET DYS 2 Room service appropriate?: Yes with Assist; Fluid consistency:: Thin DIET DYS 2  EDUCATION NEEDS:  No education needs identified at this time   Intake/Output Summary (Last 24 hours)  at 09/27/14 1051 Last data filed at 09/27/14 0750  Gross per 24 hour  Intake    120 ml  Output      0 ml  Net    120 ml    Last BM:  5/31   LOW Care Level  Leda QuailAllyson Jsaon Yoo, RD, LDN Pager [phone]

## 2014-09-27 NOTE — Progress Notes (Signed)
Escorted via EMS to [facility]

## 2014-09-27 NOTE — Discharge Instructions (Signed)
°  DIET:  Cardiac diet Dysphagia 2 diet- please see discharge instructions  DISCHARGE CONDITION:  Fair  ACTIVITY:  Activity as tolerated  OXYGEN:  Home Oxygen: Yes.     Oxygen Delivery: 3l Via nasal cannula  DISCHARGE LOCATION:  nursing home   - Will need physical therapy - check INR in 2 days- goal INR is between 2-3  If you experience worsening of your admission symptoms, develop shortness of breath, life threatening emergency, suicidal or homicidal thoughts you must seek medical attention immediately by calling 911 or calling your MD immediately  if symptoms less severe.  You Must read complete instructions/literature along with all the possible adverse reactions/side effects for all the Medicines you take and that have been prescribed to you. Take any new Medicines after you have completely understood and accpet all the possible adverse reactions/side effects.   Please note  You were cared for by a hospitalist during your hospital stay. If you have any questions about your discharge medications or the care you received while you were in the hospital after you are discharged, you can call the unit and asked to speak with the hospitalist on call if the hospitalist that took care of you is not available. Once you are discharged, your primary care physician will handle any further medical issues. Please note that NO REFILLS for any discharge medications will be authorized once you are discharged, as it is imperative that you return to your primary care physician (or establish a relationship with a primary care physician if you do not have one) for your aftercare needs so that they can reassess your need for medications and monitor your lab values.

## 2014-09-27 NOTE — Clinical Social Work Placement (Signed)
   CLINICAL SOCIAL WORK PLACEMENT  NOTE  Date:  09/27/2014  Patient Details  Name: Jamie Norton MRN: [id number] Date of Birth: Sep 26, 1921  Clinical Social Work is seeking post-discharge placement for this patient at the Skilled  Nursing Facility level of care (*CSW will initial, date and re-position this form in  chart as items are completed):  Yes   Patient/family provided with [place] Clinical Social Work Department's list of facilities offering this level of care within the geographic area requested by the patient (or if unable, by the patient's family).  Yes   Patient/family informed of their freedom to choose among providers that offer the needed level of care, that participate in Medicare, Medicaid or managed care program needed by the patient, have an available bed and are willing to accept the patient.  Yes   Patient/family informed of [facility]'s ownership interest in [facility] and [facility], as well as of the fact that they are under no obligation to receive care at these facilities.  PASRR submitted to EDS on 09/22/14     PASRR number received on 09/22/14     Existing PASRR number confirmed on       FL2 transmitted to all facilities in geographic area requested by pt/family on 09/22/14     FL2 transmitted to all facilities within larger geographic area on       Patient informed that his/her managed care company has contracts with or will negotiate with certain facilities, including the following:        Yes   Patient/family informed of bed offers received.  Patient chooses bed at  [facility]     Physician recommends and patient chooses bed at  [facility](SNF)    Patient to be transferred to  [facility]) on 09/27/14.  Patient to be transferred to facility by  [facility] )     Patient family notified on 09/27/14 of transfer.  Name of family member notified:   Jamie Norton(Jamie Norton)     PHYSICIAN       Additional Comment:     _______________________________________________ Dede QuerySarah Alisen Marsiglia, LCSW 09/27/2014, 3:00 PM

## 2014-09-28 DIAGNOSIS — F0390 Unspecified dementia without behavioral disturbance: Secondary | ICD-10-CM

## 2014-09-28 DIAGNOSIS — E441 Mild protein-calorie malnutrition: Secondary | ICD-10-CM | POA: Diagnosis not present

## 2014-09-28 DIAGNOSIS — J9601 Acute respiratory failure with hypoxia: Secondary | ICD-10-CM | POA: Diagnosis not present

## 2014-09-28 DIAGNOSIS — I482 Chronic atrial fibrillation: Secondary | ICD-10-CM | POA: Diagnosis not present

## 2014-09-28 DIAGNOSIS — J690 Pneumonitis due to inhalation of food and vomit: Secondary | ICD-10-CM | POA: Diagnosis not present

## 2014-10-02 ENCOUNTER — Ambulatory Visit: Payer: Medicare Other | Admitting: Family Medicine

## 2014-10-06 DIAGNOSIS — B379 Candidiasis, unspecified: Secondary | ICD-10-CM | POA: Diagnosis not present

## 2014-10-12 ENCOUNTER — Inpatient Hospital Stay
Admission: EM | Admit: 2014-10-12 | Discharge: 2014-10-27 | DRG: 871 | Disposition: E | Payer: Medicare Other | Attending: Internal Medicine | Admitting: Internal Medicine

## 2014-10-12 ENCOUNTER — Telehealth: Payer: Self-pay | Admitting: Internal Medicine

## 2014-10-12 ENCOUNTER — Emergency Department: Payer: Medicare Other

## 2014-10-12 DIAGNOSIS — A419 Sepsis, unspecified organism: Principal | ICD-10-CM | POA: Diagnosis present

## 2014-10-12 DIAGNOSIS — Z8249 Family history of ischemic heart disease and other diseases of the circulatory system: Secondary | ICD-10-CM

## 2014-10-12 DIAGNOSIS — K2970 Gastritis, unspecified, without bleeding: Secondary | ICD-10-CM | POA: Diagnosis present

## 2014-10-12 DIAGNOSIS — Z79899 Other long term (current) drug therapy: Secondary | ICD-10-CM

## 2014-10-12 DIAGNOSIS — J690 Pneumonitis due to inhalation of food and vomit: Secondary | ICD-10-CM | POA: Diagnosis present

## 2014-10-12 DIAGNOSIS — Z66 Do not resuscitate: Secondary | ICD-10-CM | POA: Diagnosis not present

## 2014-10-12 DIAGNOSIS — D649 Anemia, unspecified: Secondary | ICD-10-CM | POA: Diagnosis present

## 2014-10-12 DIAGNOSIS — R6520 Severe sepsis without septic shock: Secondary | ICD-10-CM | POA: Diagnosis present

## 2014-10-12 DIAGNOSIS — R1310 Dysphagia, unspecified: Secondary | ICD-10-CM

## 2014-10-12 DIAGNOSIS — Z880 Allergy status to penicillin: Secondary | ICD-10-CM

## 2014-10-12 DIAGNOSIS — F329 Major depressive disorder, single episode, unspecified: Secondary | ICD-10-CM | POA: Diagnosis present

## 2014-10-12 DIAGNOSIS — J9621 Acute and chronic respiratory failure with hypoxia: Secondary | ICD-10-CM

## 2014-10-12 DIAGNOSIS — I482 Chronic atrial fibrillation: Secondary | ICD-10-CM | POA: Diagnosis present

## 2014-10-12 DIAGNOSIS — F0390 Unspecified dementia without behavioral disturbance: Secondary | ICD-10-CM | POA: Diagnosis present

## 2014-10-12 DIAGNOSIS — Z8701 Personal history of pneumonia (recurrent): Secondary | ICD-10-CM

## 2014-10-12 DIAGNOSIS — Z9109 Other allergy status, other than to drugs and biological substances: Secondary | ICD-10-CM

## 2014-10-12 DIAGNOSIS — Z7901 Long term (current) use of anticoagulants: Secondary | ICD-10-CM

## 2014-10-12 DIAGNOSIS — E876 Hypokalemia: Secondary | ICD-10-CM | POA: Diagnosis not present

## 2014-10-12 DIAGNOSIS — N390 Urinary tract infection, site not specified: Secondary | ICD-10-CM | POA: Diagnosis present

## 2014-10-12 HISTORY — DX: Unspecified foreign body in respiratory tract, part unspecified causing other injury, initial encounter: T17.908A

## 2014-10-12 LAB — COMPREHENSIVE METABOLIC PANEL
ALBUMIN: 2.9 g/dL — AB (ref 3.5–5.0)
ALT: 36 U/L (ref 14–54)
ANION GAP: 6 (ref 5–15)
AST: 43 U/L — AB (ref 15–41)
Alkaline Phosphatase: 167 U/L — ABNORMAL HIGH (ref 38–126)
BUN: 26 mg/dL — AB (ref 6–20)
CO2: 29 mmol/L (ref 22–32)
Calcium: 8.4 mg/dL — ABNORMAL LOW (ref 8.9–10.3)
Chloride: 100 mmol/L — ABNORMAL LOW (ref 101–111)
Creatinine, Ser: 0.64 mg/dL (ref 0.44–1.00)
GFR calc Af Amer: >60 mL/min (ref >60)
GFR calc non Af Amer: >60 mL/min (ref >60)
Glucose, Bld: 160 mg/dL — ABNORMAL HIGH (ref 65–99)
POTASSIUM: 4.3 mmol/L (ref 3.5–5.1)
Sodium: 135 mmol/L (ref 135–145)
Total Bilirubin: 0.6 mg/dL (ref 0.3–1.2)
Total Protein: 7.2 g/dL (ref 6.5–8.1)

## 2014-10-12 LAB — TROPONIN I: TROPONIN I: 0.03 ng/mL (ref <0.031)

## 2014-10-12 MED ORDER — IPRATROPIUM-ALBUTEROL 0.5-2.5 (3) MG/3ML IN SOLN
3.0000 mL | Freq: Once | RESPIRATORY_TRACT | Status: AC
  Administered 2014-10-12: 3 mL via RESPIRATORY_TRACT

## 2014-10-12 MED ORDER — VANCOMYCIN HCL IN DEXTROSE 1-5 GM/200ML-% IV SOLN
INTRAVENOUS | Status: AC
  Administered 2014-10-12: 1000 mg via INTRAVENOUS
  Filled 2014-10-12: qty 200

## 2014-10-12 MED ORDER — VANCOMYCIN HCL IN DEXTROSE 1-5 GM/200ML-% IV SOLN
1000.0000 mg | Freq: Once | INTRAVENOUS | Status: AC
Start: 2014-10-12 — End: 2014-10-13
  Administered 2014-10-12: 1000 mg via INTRAVENOUS

## 2014-10-12 MED ORDER — IPRATROPIUM-ALBUTEROL 0.5-2.5 (3) MG/3ML IN SOLN
RESPIRATORY_TRACT | Status: AC
  Administered 2014-10-12: 3 mL via RESPIRATORY_TRACT
  Filled 2014-10-12: qty 6

## 2014-10-12 MED ORDER — FUROSEMIDE 10 MG/ML IJ SOLN
40.0000 mg | Freq: Once | INTRAMUSCULAR | Status: AC
  Administered 2014-10-12: 40 mg via INTRAVENOUS

## 2014-10-12 MED ORDER — PIPERACILLIN-TAZOBACTAM 3.375 G IVPB: 3.3750 g | Freq: Once | INTRAVENOUS | Status: DC

## 2014-10-12 MED ORDER — FUROSEMIDE 10 MG/ML IJ SOLN
INTRAMUSCULAR | Status: AC
  Administered 2014-10-12: 40 mg via INTRAVENOUS
  Filled 2014-10-12: qty 4

## 2014-10-12 MED ORDER — CEFTRIAXONE SODIUM IN DEXTROSE 40 MG/ML IV SOLN
2.0000 g | Freq: Once | INTRAVENOUS | Status: AC
  Administered 2014-10-13: 2 g via INTRAVENOUS
  Filled 2014-10-12: qty 50

## 2014-10-12 NOTE — ED Provider Notes (Signed)
[facility] Emergency Department Provider Note  ____________________________________________  Time seen: 10:45 PM  I have reviewed the triage vital signs and the nursing notes.   HISTORY  Chief Complaint Shortness of Breath  History Limited due to Dementia.  History provider by EMS   HPI Jamie Norton is a 81 y.o. female presents with restaurant distress and hypoxia and saturation in the 40s per EMS. Patient currently on a nonrebreather satting 90% with obvious respiratory distress. Patient denies any difficulty breathing at this time patient denies any chest pain.     Past Medical History  Diagnosis Date  . Atrial fibrillation   . Dementia   . Gastritis   . Anemia   . Depression   . Aspiration into airway     Patient Active Problem List   Diagnosis Date Noted  . Aspiration pneumonia 09/20/2014    History reviewed. No pertinent past surgical history.  Current Outpatient Rx  Name  Route  Sig  Dispense  Refill  . Calcium Citrate-Vitamin D (CITRACAL PETITES/VITAMIN D PO)   Oral   Take 2 tablets by mouth 2 (two) times daily.         . Carboxymethylcellulose Sodium (REFRESH LIQUIGEL OP)   Ophthalmic   Apply 1 application to eye daily.         . carvedilol (COREG) 3.125 MG tablet   Oral   Take 1 tablet (3.125 mg total) by mouth 2 (two) times daily with a meal.   60 tablet   0   . cholecalciferol (VITAMIN D) 1000 UNITS tablet   Oral   Take 1,000 Units by mouth daily.         Marland Kitchen donepezil (ARICEPT) 10 MG tablet   Oral   Take 10 mg by mouth at bedtime.         . furosemide (LASIX) 20 MG tablet   Oral   Take 1 tablet (20 mg total) by mouth daily.   30 tablet   0   . gabapentin (NEURONTIN) 100 MG capsule   Oral   Take 100 mg by mouth 3 (three) times daily as needed (for pain).         Marland Kitchen guaiFENesin (MUCINEX) 600 MG 12 hr tablet   Oral   Take 1 tablet (600 mg total) by mouth 2 (two) times daily.   10 tablet   0    . Ipratropium-Albuterol (COMBIVENT RESPIMAT) 20-100 MCG/ACT AERS respimat   Inhalation   Inhale 1 puff into the lungs every 6 (six) hours.   1 Inhaler   1   . ipratropium-albuterol (DUONEB) 0.5-2.5 (3) MG/3ML SOLN   Nebulization   Take 3 mLs by nebulization every 6 (six) hours as needed (wheezing).   360 mL   0   . levofloxacin (LEVAQUIN) 500 MG tablet   Oral   Take 1 tablet (500 mg total) by mouth daily.   5 tablet   0   . Multiple Vitamins-Minerals (MULTIVITAMIN ADULTS 50+ PO)   Oral   Take 1 tablet by mouth daily.         Marland Kitchen omeprazole (PRILOSEC) 40 MG capsule   Oral   Take 40 mg by mouth daily.         Bertram Gala Glycol-Propyl Glycol 0.4-0.3 % SOLN   Ophthalmic   Apply 1 drop to eye 2 (two) times daily.         . sertraline (ZOLOFT) 50 MG tablet   Oral   Take 50 mg  by mouth at bedtime.         . vitamin C (ASCORBIC ACID) 500 MG tablet   Oral   Take 500 mg by mouth daily.         Marland Kitchen warfarin (COUMADIN) 2 MG tablet   Oral   Take 1 tablet (2 mg total) by mouth daily.   30 tablet   2     Allergies Amoxicillin; Doxycycline; Polytrim; and Tolmetin  No family history on file.  Social History History  Substance Use Topics  . Smoking status: Never Smoker   . Smokeless tobacco: Not on file  . Alcohol Use: No    Review of Systems  Constitutional: Negative for fever. Eyes: Negative for visual changes. ENT: Negative for sore throat. Cardiovascular: Negative for chest pain. Respiratory: Positive for shortness of breath. Gastrointestinal: Negative for abdominal pain, vomiting and diarrhea. Genitourinary: Negative for dysuria. Musculoskeletal: Negative for back pain. Skin: Negative for rash. Neurological: Negative for headaches, focal weakness or numbness.   10-point ROS otherwise negative.  ____________________________________________   PHYSICAL EXAM:  VITAL SIGNS: ED Triage Vitals  Enc Vitals Group     BP 10/11/2014 2304 171/114 mmHg      Pulse Rate 10/01/2014 2304 116     Resp --      Temp 10/04/2014 2305 98.6 F (37 C)     Temp Source 10/15/2014 2305 Oral     SpO2 10/14/2014 2258 90 %     Weight 10/10/2014 2304 120 lb (54.432 kg)     Height 10/21/2014 2304 5\' 4"  (1.626 m)     Head Cir --      Peak Flow --      Pain Score 10/07/2014 2258 0     Pain Loc --      Pain Edu? --      Excl. in GC? --      Constitutional: Alert and oriented. Apparent distress Eyes: Conjunctivae are normal. PERRL. Normal extraocular movements. ENT   Head: Normocephalic and atraumatic.   Nose: No congestion/rhinnorhea.   Mouth/Throat: Mucous membranes are moist.   Neck: No stridor. Hematological/Lymphatic/Immunilogical: No cervical lymphadenopathy. Cardiovascular: Normal rate, regular rhythm. Normal and symmetric distal pulses are present in all extremities. No murmurs, rubs, or gallops. Respiratory: Diffuse bilateral rhonchi. Tachypnea. Positive accessory muscle use Gastrointestinal: Soft and nontender. No distention. There is no CVA tenderness. Genitourinary: deferred Musculoskeletal: Nontender with normal range of motion in all extremities. No joint effusions.  No lower extremity tenderness nor edema. Neurologic:  Normal speech and language. No gross focal neurologic deficits are appreciated. Speech is normal.  Skin:  Skin is warm, dry and intact. No rash noted. Psychiatric: Mood and affect are normal. Speech and behavior are normal. Patient exhibits appropriate insight and judgment.  ____________________________________________    LABS (pertinent positives/negatives)  Labs Reviewed  CBC - Abnormal; Notable for the following:    WBC 18.9 (*)    RDW 15.0 (*)    All other components within normal limits  COMPREHENSIVE METABOLIC PANEL - Abnormal; Notable for the following:    Chloride 100 (*)    Glucose, Bld 160 (*)    BUN 26 (*)    Calcium 8.4 (*)    Albumin 2.9 (*)    AST 43 (*)    Alkaline Phosphatase 167 (*)    All  other components within normal limits  URINALYSIS COMPLETEWITH MICROSCOPIC ([facility] ONLY) - Abnormal; Notable for the following:    Color, Urine YELLOW (*)    APPearance HAZY (*)  Protein, ur 30 (*)    Leukocytes, UA 1+ (*)    Bacteria, UA FEW (*)    Squamous Epithelial / LPF 0-5 (*)    All other components within normal limits  CULTURE, BLOOD (ROUTINE X 2)  CULTURE, BLOOD (ROUTINE X 2)  TROPONIN I     ____________________________________________   EKG interpreted by me Dr. Bayard Males   Date: 10/13/2014  Rate: 110  Rhythm: Atrial fibrillation with rapid ventricular response  QRS Axis: normal  Intervals: normal  ST/T Wave abnormalities: normal  Conduction Disutrbances: none  Narrative Interpretation: unremarkable      ____________________________________________    RADIOLOGY  IMPRESSION: Residual small to moderate left-sided pleural effusion is improved from the prior study. Bilateral airspace opacification is also mildly improved, and likely reflects improving pneumonia or ARDS.   ____________________________________________   PROCEDURES  Procedure(s) performed: BiPAP applied  Critical Care performed:72minutes  ____________________________________________   INITIAL IMPRESSION / ASSESSMENT AND PLAN / ED COURSE  Pertinent labs & imaging results that were available during my care of the patient were reviewed by me and considered in my medical decision making (see chart for details).  History of physical exam consistent with rest or distress most likely secondary to aspiration and pneumonia recurrent. Given increased work of breathing with hypoxia and accessory muscle use BiPAP was applied. Patient received IV vancomycin and Zosyn. Patient will be admitted to the hospital for further enzymes  ____________________________________________   FINAL CLINICAL IMPRESSION(S) / ED DIAGNOSES  Final diagnoses:  Sepsis, due to unspecified organism  Aspiration  pneumonia, unspecified aspiration pneumonia type      Darci Current, MD 10/13/14 [phone]

## 2014-10-12 NOTE — ED Notes (Signed)
Patient from [facility] for SOB, decreased spo2.

## 2014-10-12 NOTE — Telephone Encounter (Signed)
Joline Salt with [facility] called and reported that earlier Ms Woelk became sob and had decreased O2 sats.  Heart rated 120s.  Has a history of afib.  Complaining of being sob.  Family made aware and requested transfer.  She was calling to notify me that ms Buick had been transferred to the ER for evaluation.  Pt already in ER.

## 2014-10-12 NOTE — ED Notes (Signed)
Patient medicated with lasix, duoneb. Respiratory has applied bipap. Labs drawn and sent.

## 2014-10-13 ENCOUNTER — Encounter: Payer: Self-pay | Admitting: Internal Medicine

## 2014-10-13 DIAGNOSIS — J690 Pneumonitis due to inhalation of food and vomit: Secondary | ICD-10-CM | POA: Diagnosis present

## 2014-10-13 DIAGNOSIS — J189 Pneumonia, unspecified organism: Secondary | ICD-10-CM | POA: Diagnosis not present

## 2014-10-13 DIAGNOSIS — Z7901 Long term (current) use of anticoagulants: Secondary | ICD-10-CM | POA: Diagnosis not present

## 2014-10-13 DIAGNOSIS — Z515 Encounter for palliative care: Secondary | ICD-10-CM | POA: Diagnosis not present

## 2014-10-13 DIAGNOSIS — N390 Urinary tract infection, site not specified: Secondary | ICD-10-CM | POA: Diagnosis present

## 2014-10-13 DIAGNOSIS — Z880 Allergy status to penicillin: Secondary | ICD-10-CM | POA: Diagnosis not present

## 2014-10-13 DIAGNOSIS — I482 Chronic atrial fibrillation: Secondary | ICD-10-CM | POA: Diagnosis present

## 2014-10-13 DIAGNOSIS — J9621 Acute and chronic respiratory failure with hypoxia: Secondary | ICD-10-CM | POA: Diagnosis present

## 2014-10-13 DIAGNOSIS — Z9109 Other allergy status, other than to drugs and biological substances: Secondary | ICD-10-CM | POA: Diagnosis not present

## 2014-10-13 DIAGNOSIS — A419 Sepsis, unspecified organism: Secondary | ICD-10-CM | POA: Diagnosis present

## 2014-10-13 DIAGNOSIS — Z8249 Family history of ischemic heart disease and other diseases of the circulatory system: Secondary | ICD-10-CM | POA: Diagnosis not present

## 2014-10-13 DIAGNOSIS — D649 Anemia, unspecified: Secondary | ICD-10-CM | POA: Diagnosis present

## 2014-10-13 DIAGNOSIS — F329 Major depressive disorder, single episode, unspecified: Secondary | ICD-10-CM | POA: Diagnosis present

## 2014-10-13 DIAGNOSIS — E876 Hypokalemia: Secondary | ICD-10-CM | POA: Diagnosis not present

## 2014-10-13 DIAGNOSIS — R6520 Severe sepsis without septic shock: Secondary | ICD-10-CM | POA: Diagnosis present

## 2014-10-13 DIAGNOSIS — K2970 Gastritis, unspecified, without bleeding: Secondary | ICD-10-CM | POA: Diagnosis present

## 2014-10-13 DIAGNOSIS — I4891 Unspecified atrial fibrillation: Secondary | ICD-10-CM | POA: Diagnosis not present

## 2014-10-13 DIAGNOSIS — Z79899 Other long term (current) drug therapy: Secondary | ICD-10-CM | POA: Diagnosis not present

## 2014-10-13 DIAGNOSIS — Z66 Do not resuscitate: Secondary | ICD-10-CM | POA: Diagnosis not present

## 2014-10-13 DIAGNOSIS — R1310 Dysphagia, unspecified: Secondary | ICD-10-CM | POA: Diagnosis present

## 2014-10-13 DIAGNOSIS — F0390 Unspecified dementia without behavioral disturbance: Secondary | ICD-10-CM | POA: Diagnosis present

## 2014-10-13 DIAGNOSIS — Z8701 Personal history of pneumonia (recurrent): Secondary | ICD-10-CM | POA: Diagnosis not present

## 2014-10-13 LAB — URINALYSIS COMPLETE WITH MICROSCOPIC (ARMC ONLY)
BILIRUBIN URINE: NEGATIVE
GLUCOSE, UA: NEGATIVE mg/dL
Hgb urine dipstick: NEGATIVE
KETONES UR: NEGATIVE mg/dL
Nitrite: NEGATIVE
PH: 6 (ref 5.0–8.0)
Protein, ur: 30 mg/dL — AB
Specific Gravity, Urine: 1.016 (ref 1.005–1.030)

## 2014-10-13 LAB — PROTIME-INR
INR: 2.27
PROTHROMBIN TIME: 25.2 s — AB (ref 11.4–15.0)

## 2014-10-13 LAB — CBC
HEMATOCRIT: 42.8 % (ref 35.0–47.0)
Hemoglobin: 13.7 g/dL (ref 12.0–16.0)
MCH: 31.2 pg (ref 26.0–34.0)
MCHC: 32.1 g/dL (ref 32.0–36.0)
MCV: 97.3 fL (ref 80.0–100.0)
PLATELETS: 268 10*3/uL (ref 150–440)
RBC: 4.4 MIL/uL (ref 3.80–5.20)
RDW: 15 % — ABNORMAL HIGH (ref 11.5–14.5)
WBC: 18.9 10*3/uL — ABNORMAL HIGH (ref 3.6–11.0)

## 2014-10-13 LAB — MRSA PCR SCREENING: MRSA BY PCR: NEGATIVE

## 2014-10-13 MED ORDER — CARBOXYMETHYLCELLULOSE SODIUM 1 % OP SOLN
1.0000 [drp] | Freq: Every day | OPHTHALMIC | Status: DC
  Filled 2014-10-13 (×7): qty 1

## 2014-10-13 MED ORDER — PIPERACILLIN-TAZOBACTAM 4.5 G IVPB
4.5000 g | Freq: Three times a day (TID) | INTRAVENOUS | Status: DC
  Administered 2014-10-13: 4.5 g via INTRAVENOUS
  Filled 2014-10-13 (×5): qty 100

## 2014-10-13 MED ORDER — IPRATROPIUM-ALBUTEROL 0.5-2.5 (3) MG/3ML IN SOLN
3.0000 mL | RESPIRATORY_TRACT | Status: DC
  Administered 2014-10-13 – 2014-10-14 (×6): 3 mL via RESPIRATORY_TRACT
  Filled 2014-10-13 (×6): qty 3

## 2014-10-13 MED ORDER — PIPERACILLIN-TAZOBACTAM 3.375 G IVPB
3.3750 g | Freq: Three times a day (TID) | INTRAVENOUS | Status: DC
  Administered 2014-10-13: 3.375 g via INTRAVENOUS
  Filled 2014-10-13 (×4): qty 50

## 2014-10-13 MED ORDER — IPRATROPIUM-ALBUTEROL 0.5-2.5 (3) MG/3ML IN SOLN
RESPIRATORY_TRACT | Status: AC
  Administered 2014-10-13: 3 mL via RESPIRATORY_TRACT
  Filled 2014-10-13: qty 3

## 2014-10-13 MED ORDER — LEVOFLOXACIN IN D5W 250 MG/50ML IV SOLN
250.0000 mg | INTRAVENOUS | Status: DC
  Administered 2014-10-13: 250 mg via INTRAVENOUS
  Filled 2014-10-13 (×2): qty 50

## 2014-10-13 MED ORDER — ONDANSETRON HCL 4 MG/2ML IJ SOLN: 4.0000 mg | Freq: Four times a day (QID) | INTRAMUSCULAR | Status: DC | PRN

## 2014-10-13 MED ORDER — POLYETHYL GLYCOL-PROPYL GLYCOL 0.4-0.3 % OP SOLN: 1.0000 [drp] | Freq: Two times a day (BID) | OPHTHALMIC | Status: DC

## 2014-10-13 MED ORDER — SERTRALINE HCL 50 MG PO TABS
50.0000 mg | ORAL_TABLET | Freq: Every day | ORAL | Status: DC
  Administered 2014-10-13 – 2014-10-15 (×3): 50 mg via ORAL
  Filled 2014-10-13 (×3): qty 1

## 2014-10-13 MED ORDER — SODIUM CHLORIDE 0.9 % IV SOLN
INTRAVENOUS | Status: DC
  Administered 2014-10-13 – 2014-10-14 (×3): via INTRAVENOUS

## 2014-10-13 MED ORDER — VANCOMYCIN HCL IN DEXTROSE 1-5 GM/200ML-% IV SOLN
1000.0000 mg | INTRAVENOUS | Status: DC
  Filled 2014-10-13: qty 200

## 2014-10-13 MED ORDER — CARVEDILOL 3.125 MG PO TABS
3.1250 mg | ORAL_TABLET | Freq: Two times a day (BID) | ORAL | Status: DC
  Administered 2014-10-13 – 2014-10-16 (×6): 3.125 mg via ORAL
  Filled 2014-10-13 (×6): qty 1

## 2014-10-13 MED ORDER — NYSTATIN 100000 UNIT/ML MT SUSP
5.0000 mL | Freq: Four times a day (QID) | OROMUCOSAL | Status: DC
  Administered 2014-10-13 – 2014-10-16 (×14): 500000 [IU] via ORAL
  Filled 2014-10-13 (×12): qty 5

## 2014-10-13 MED ORDER — ACETAMINOPHEN 650 MG RE SUPP: 650.0000 mg | Freq: Four times a day (QID) | RECTAL | Status: DC | PRN

## 2014-10-13 MED ORDER — POLYETHYL GLYCOL-PROPYL GLYCOL 0.4-0.3 % OP SOLN
1.0000 [drp] | Freq: Two times a day (BID) | OPHTHALMIC | Status: DC
  Filled 2014-10-13 (×2): qty 1

## 2014-10-13 MED ORDER — SODIUM CHLORIDE 0.9 % IJ SOLN
3.0000 mL | Freq: Two times a day (BID) | INTRAMUSCULAR | Status: DC
  Administered 2014-10-13 – 2014-10-16 (×7): 3 mL via INTRAVENOUS

## 2014-10-13 MED ORDER — HEPARIN SODIUM (PORCINE) 5000 UNIT/ML IJ SOLN: 5000.0000 [IU] | Freq: Three times a day (TID) | INTRAMUSCULAR | Status: DC

## 2014-10-13 MED ORDER — WARFARIN SODIUM 2 MG PO TABS
2.0000 mg | ORAL_TABLET | Freq: Every day | ORAL | Status: DC
  Administered 2014-10-13: 2 mg via ORAL
  Filled 2014-10-13: qty 2

## 2014-10-13 MED ORDER — ACETAMINOPHEN 325 MG PO TABS: 650.0000 mg | ORAL_TABLET | Freq: Four times a day (QID) | ORAL | Status: DC | PRN

## 2014-10-13 MED ORDER — POLYVINYL ALCOHOL 1.4 % OP SOLN
1.0000 [drp] | Freq: Two times a day (BID) | OPHTHALMIC | Status: DC
  Administered 2014-10-13 (×2): 1 [drp] via OPHTHALMIC
  Filled 2014-10-13 (×2): qty 15

## 2014-10-13 MED ORDER — ONDANSETRON HCL 4 MG PO TABS: 4.0000 mg | ORAL_TABLET | Freq: Four times a day (QID) | ORAL | Status: DC | PRN

## 2014-10-13 MED ORDER — VITAMIN D 1000 UNITS PO TABS
1000.0000 [IU] | ORAL_TABLET | Freq: Every day | ORAL | Status: DC
  Administered 2014-10-14 – 2014-10-16 (×3): 1000 [IU] via ORAL
  Filled 2014-10-13 (×3): qty 1

## 2014-10-13 NOTE — Telephone Encounter (Signed)
PLEASE NOTE: All timestamps contained within this report are represented as [place] Standard Time. CONFIDENTIALTY NOTICE: This fax transmission is intended only for the addressee. It contains information that is legally privileged, confidential or otherwise protected from use or disclosure. If you are not the intended recipient, you are strictly prohibited from reviewing, disclosing, copying using or disseminating any of this information or taking any action in reliance on or regarding this information. If you have received this fax in error, please notify us immediately by telephone so that we can arrange for its return to [place]. Phone: [phone], Toll-Free: [phone], Fax: [phone] Page: 1 of 1 Call Id: [id number] [facility] Night - Client TELEPHONE ADVICE RECORD [facility] Medical Call Center Patient Name: VICTORINA GROTHEER Gender: Female DOB: 07-10-1921 Age: 81 Y 6 M 20 D Return Phone Number: Address: City/State/Zip: [place] [facility] Night - Client Client Site [facility] - Night Physician [place] Contact Type Call Call Type Page Only Caller Name Ann Relationship To Patient Provider Is this call to report lab results? No Return Phone Number Please choose phone number Initial Comment Cal[place] States Dewayne Hatch from [facility] at ph [phone], Resident was sent to ER her Oxygen Saturation dropped to 40 % Nurse Assessment Guidelines Guideline Title Affirmed Question Affirmed Notes Nurse Date/Time (Eastern Time) Disp. Time Lamount Cohen Time) Disposition Final User 10/26/2014 11:07:30 PM Send to [facility] Paging Queue Namon Cirri 10/25/2014 11:12:39 PM Paged On Call to Other Provider Ulice Dash 10/19/2014 11:13:03 PM Page Completed Yes Ulice Dash After Care Instructions Given Call Event Type User Date / Time Description Paging [facility] Phone DateTime Result/Outcome Message Type Notes Dale Falman [phone] 10/15/2014 11:12:39 PM Paged On Call to Other Provider Doctor Paged Dale [place] 09/30/2014 11:12:50 PM Spoke with On Call - General Message Result Provided on-call with page details.

## 2014-10-13 NOTE — H&P (Signed)
[facility] - [place] at [facility]   PATIENT NAME: Jamie Norton    MR#:  [id number]  DATE OF BIRTH:  18-Jun-1921   DATE OF ADMISSION:  November 02, 2014  PRIMARY CARE PHYSICIAN: letvak  REQUESTING/REFERRING PHYSICIAN: Bayard Males  CHIEF COMPLAINT:   Chief Complaint  Patient presents with  . Shortness of Breath    HISTORY OF PRESENT ILLNESS:  Jamie Norton  is a 81 y.o. female with a known history of dysphagia, chronic A. fib, dementia presenting with shortness of breath. Recently at [facility] discharge 09/22/14 discharge diagnosis aspiration pneumonia, acute on chronic respiratory failure. At that time she was discharged to nursing facility ever since she has been on 2 L nasal cannula oxygen at baseline. She was in her usual state of health however, after eating dinner she was noticed to have respiratory distress. She is brought to the hospital for further workup and evaluation, required BiPAP therapy to maintain oxygen saturations in ease work of breathing. She is currently unable to provide meaningful information given mental status/medical condition history obtained from family present at bedside.  PAST MEDICAL HISTORY:   Past Medical History  Diagnosis Date  . Atrial fibrillation   . Dementia   . Gastritis   . Anemia   . Depression   . Aspiration into airway     PAST SURGICAL HISTORY:  History reviewed. No pertinent past surgical history.  SOCIAL HISTORY:   History  Substance Use Topics  . Smoking status: Never Smoker   . Smokeless tobacco: Not on file  . Alcohol Use: No    FAMILY HISTORY:   Family History  Problem Relation Age of Onset  . Hypertension Other     DRUG ALLERGIES:   Allergies  Allergen Reactions  . Amoxicillin Itching  . Doxycycline Other (See Comments)    Reaction: Unknown  . Polytrim [Polymyxin B-Trimethoprim] Other (See Comments)    Reaction:  Eye irritation   . Tolmetin Itching     REVIEW OF SYSTEMS:  Unable to obtain given patient's mental status/medical condition   MEDICATIONS AT HOME:   Prior to Admission medications   Medication Sig Start Date End Date Taking? Authorizing Provider  Carboxymethylcellulose Sodium (REFRESH LIQUIGEL OP) Apply 1 application to eye at bedtime.    Yes Historical Provider, MD  carvedilol (COREG) 3.125 MG tablet Take 1 tablet (3.125 mg total) by mouth 2 (two) times daily with a meal. 09/22/14  Yes Enid Baas, MD  cholecalciferol (VITAMIN D) 1000 UNITS tablet Take 1,000 Units by mouth daily.   Yes Historical Provider, MD  furosemide (LASIX) 20 MG tablet Take 1 tablet (20 mg total) by mouth daily. Patient taking differently: Take 20 mg by mouth daily as needed for edema.  09/27/14  Yes Enid Baas, MD  ipratropium-albuterol (DUONEB) 0.5-2.5 (3) MG/3ML SOLN Take 3 mLs by nebulization every 6 (six) hours as needed (wheezing). Patient taking differently: Take 3 mLs by nebulization every 6 (six) hours as needed (for wheezing).  09/27/14  Yes Enid Baas, MD  nystatin (MYCOSTATIN) 100000 UNIT/ML suspension Take 5 mLs by mouth 4 (four) times daily.   Yes Historical Provider, MD  Polyethyl Glycol-Propyl Glycol 0.4-0.3 % SOLN Apply 1 drop to eye 2 (two) times daily.   Yes Historical Provider, MD  sertraline (ZOLOFT) 50 MG tablet Take 50 mg by mouth at bedtime.   Yes Historical Provider, MD  warfarin (COUMADIN) 2 MG tablet Take 1 tablet (2 mg total) by mouth daily. 09/27/14  Yes Enid Baas, MD  guaiFENesin (MUCINEX) 600 MG 12 hr tablet Take 1 tablet (600 mg total) by mouth 2 (two) times daily. Patient not taking: Reported on 10/09/2014 09/22/14   Enid Baas, MD  Ipratropium-Albuterol (COMBIVENT RESPIMAT) 20-100 MCG/ACT AERS respimat Inhale 1 puff into the lungs every 6 (six) hours. Patient not taking: Reported on 10/11/2014 09/27/14   Enid Baas, MD  levofloxacin (LEVAQUIN) 500 MG tablet Take 1 tablet (500 mg total) by  mouth daily. Patient not taking: Reported on 10/23/2014 09/27/14   Enid Baas, MD      VITAL SIGNS:  Blood pressure 138/89, pulse 110, temperature 98.6 F (37 C), temperature source Oral, resp. rate 31, height  (1.626 m), weight 120 lb (54.432 kg), SpO2 95 %.  PHYSICAL EXAMINATION:  VITAL SIGNS: Filed Vitals:   10/03/2014 2330  BP: 138/89  Pulse: 110  Temp:   Resp: 31   GENERAL:81 y.o.female currently acute on chronic ill-appearing given respiratory distress HEAD: Normocephalic, atraumatic.  EYES: Pupils equal, round, reactive to light. Extraocular muscles intact. No scleral icterus.  MOUTH: Dry mucosal membrane. Dentition intact. No abscess noted.  EAR, NOSE, THROAT: Clear without exudates. No external lesions.  NECK: Supple. No thyromegaly. No nodules. No JVD.  PULMONARY: Coarse rhonci throughout all lung fields, worse on left. Tachypnea without use of accessory muscles, poor respiratory effort requiring BiPAP therapy.  CHEST: Nontender to palpation.  CARDIOVASCULAR: S1 and S2. Irregular rate and rhythm. No murmurs, rubs, or gallops. 2+ edema. Pedal pulses 2+ bilaterally.  GASTROINTESTINAL: Soft, nontender, nondistended. No masses. Positive bowel sounds. No hepatosplenomegaly.  MUSCULOSKELETAL: No swelling, clubbing, or edema. Range of motion full in all extremities.  NEUROLOGIC: Cranial nerves II through XII are intact. No gross focal neurological deficits. Sensation intact. Reflexes intact.  SKIN: No ulceration, lesions, rashes, or cyanosis. Skin warm and dry. Turgor intact.  PSYCHIATRIC: Unable to fully assess given patient's mental status/medical condition. The patient is awake, alert.  LABORATORY PANEL:   CBC  Recent Labs Lab 10/25/2014 2307  WBC 18.9*  HGB 13.7  HCT 42.8  PLT 268   ------------------------------------------------------------------------------------------------------------------  Chemistries   Recent Labs Lab 10/11/2014 2257  NA 135  K  4.3  CL 100*  CO2 29  GLUCOSE 160*  BUN 26*  CREATININE 0.64  CALCIUM 8.4*  AST 43*  ALT 36  ALKPHOS 167*  BILITOT 0.6   ------------------------------------------------------------------------------------------------------------------  Cardiac Enzymes  Recent Labs Lab 10/05/2014 2257  TROPONINI 0.03   ------------------------------------------------------------------------------------------------------------------  RADIOLOGY:  Dg Chest Portable 1 View  10/13/2014   CLINICAL DATA:  Acute onset of shortness of breath. Initial encounter.  EXAM: PORTABLE CHEST - 1 VIEW  COMPARISON:  Chest radiograph performed 09/26/2014  FINDINGS: The lungs are well-aerated. A residual small to moderate left-sided pleural effusion is noted, improved from the prior study. Bilateral airspace opacification is also mildly improved, and likely reflects improving pneumonia or ARDS.  The cardiomediastinal silhouette is borderline normal in size. No acute osseous abnormalities are seen.  IMPRESSION: Residual small to moderate left-sided pleural effusion is improved from the prior study. Bilateral airspace opacification is also mildly improved, and likely reflects improving pneumonia or ARDS.   Electronically Signed   By: Roanna Raider M.D.   On: 10/22/2014 23:43    EKG:   Orders placed or performed during the hospital encounter of 10/15/2014  . ED EKG  . ED EKG    IMPRESSION AND PLAN:   81 year old Caucasian female history of dementia, dysphagia, recent aspiration pneumonia  presenting with acute onset shortness of breath.  1.Sepsis, meeting septic criteria by heart rate, leukocytosis, respiratory rate present on arrival. Source pulmonary. Panculture. Broad-spectrum antibiotics including vancomycin/Zosyn and taper antibiotics when culture data returns.  Continue IV fluid hydration to keep mean arterial pressure greater than 65. He may require pressor therapy if blood pressure worsens. We will repeat lactic  acid given the initial is greater than 2.2.   2. Acute on chronic restaurant failure with hypoxia: Secondary to aspiration pneumonitis/pneumonia, continue supplemental O2 to keep SaO2 greater than 92%, wean BiPAP therapy as tolerated, DuoNeb treatments every 4 hours  3. Atrial fibrillation, rapid ventricular response: Goal heart rate less than 120, IV Cardizem if required 4. Dysphagia: Place nothing by mouth will require speech evaluation she was already on dysphagia 3 diet 5. Venous thromboembolism prophylactic therapeutic warfarin     All the records are reviewed and case discussed with ED provider. Management plans discussed with the patient, family and they are in agreement.  CODE STATUS: DO NOT RESUSCITATE  TOTAL TIME TAKING CARE OF THIS PATIENT: 55 critical minutes.    Hower,  Mardi Mainland.D on 10/13/2014 at 1:15 AM  Between 7am to 6pm - Pager - [phone]  After 6pm: House Pager: - [phone]  [facility]  Office  [phone]  CC: Primary care physician; Alphonsus Sias

## 2014-10-13 NOTE — Progress Notes (Addendum)
ANTIBIOTIC CONSULT NOTE - INITIAL  Pharmacy Consult for vancomycin and Zosyn dosing Indication: sepsis  Allergies  Allergen Reactions  . Amoxicillin Itching  . Doxycycline Other (See Comments)    Reaction: Unknown  . Polytrim [Polymyxin B-Trimethoprim] Other (See Comments)    Reaction:  Eye irritation   . Tolmetin Itching    Patient Measurements: Height:  (162.6 cm) Weight: 120 lb (54.432 kg) IBW/kg (Calculated) : 54.7 Adjusted Body Weight: 54.4 kg  Vital Signs: Temp: 98.6 F (37 C) (06/16 2305) Temp Source: Oral (06/16 2305) BP: 146/73 mmHg (06/17 0400) Pulse Rate: 101 (06/17 0400) Intake/Output from previous day: 06/16 0701 - 06/17 0700 In: 35 [I.V.:35] Out: 50 [Urine:50] Intake/Output from this shift: Total I/O In: 35 [I.V.:35] Out: 50 [Urine:50]  Labs:  Recent Labs  10/30/2014 2257 10/30/14 2307  WBC  --  18.9*  HGB  --  13.7  PLT  --  268  CREATININE 0.64  --    Estimated Creatinine Clearance: 40.1 mL/min (by C-G formula based on Cr of 0.64). No results for input(s): VANCOTROUGH, VANCOPEAK, VANCORANDOM, GENTTROUGH, GENTPEAK, GENTRANDOM, TOBRATROUGH, TOBRAPEAK, TOBRARND, AMIKACINPEAK, AMIKACINTROU, AMIKACIN in the last 72 hours.   Microbiology: Recent Results (from the past 720 hour(s))  Blood culture (routine x 2)     Status: None   Collection Time: 09/20/14  4:20 PM  Result Value Ref Range Status   Specimen Description BLOOD  Final   Special Requests NONE  Final   Culture NO GROWTH 5 DAYS  Final   Report Status 09/25/2014 FINAL  Final  Blood culture (routine x 2)     Status: None   Collection Time: 09/20/14  8:00 PM  Result Value Ref Range Status   Specimen Description BLOOD  Final   Special Requests NONE  Final   Culture NO GROWTH 5 DAYS  Final   Report Status 09/25/2014 FINAL  Final  MRSA PCR Screening     Status: None   Collection Time: 09/21/14  3:45 AM  Result Value Ref Range Status   MRSA by PCR NEGATIVE NEGATIVE Final    Comment:         The GeneXpert MRSA Assay (FDA approved for NASAL specimens only), is one component of a comprehensive MRSA colonization surveillance program. It is not intended to diagnose MRSA infection nor to guide or monitor treatment for MRSA infections.   Urine culture     Status: None   Collection Time: 09/21/14 11:02 AM  Result Value Ref Range Status   Specimen Description URINE, RANDOM  Final   Special Requests NONE  Final   Culture NO GROWTH 2 DAYS  Final   Report Status 09/23/2014 FINAL  Final  C difficile quick scan w PCR reflex [facility])     Status: None   Collection Time: 09/23/14  7:33 AM  Result Value Ref Range Status   C Diff antigen NEGATIVE  Final   C Diff toxin NEGATIVE  Final   C Diff interpretation Negative for C. difficile  Final    Medical History: Past Medical History  Diagnosis Date  . Atrial fibrillation   . Dementia   . Gastritis   . Anemia   . Depression   . Aspiration into airway     Medications:   Assessment: Blood cx pending CXR: Bilateral opacities improving  Goal of Therapy:  Vancomycin trough level 15-20 mcg/ml  Plan:  TBW 54.4kg  IBW 54.7kg  DW 54.4kg  Vd 38L kei 0.038 hr-1  T1/2 18 hours  1 gram vancomycin in ED. 1 gram q 24 hours ordered as continuation. Level before 5th dose.  Zosyn 4.5 grams q 8 hours ordered for Pseudomonas risk of abx received during recent admission.   Dalan Cowger S 10/13/2014,4:42 AM

## 2014-10-13 NOTE — Progress Notes (Signed)
ANTIBIOTIC CONSULT NOTE - INITIAL  Pharmacy Consult for Levaquin Indication: UTI  Allergies  Allergen Reactions  . Amoxicillin Itching  . Doxycycline Other (See Comments)    Reaction: Unknown  . Polytrim [Polymyxin B-Trimethoprim] Other (See Comments)    Reaction:  Eye irritation   . Tolmetin Itching    Patient Measurements: Height:  (162.6 cm) Weight: 120 lb (54.432 kg) IBW/kg (Calculated) : 54.7 Adjusted Body Weight:   Vital Signs: Temp: 98.6 F (37 C) (06/17 0800) Temp Source: Oral (06/17 0800) BP: 135/47 mmHg (06/17 1200) Pulse Rate: 83 (06/17 1200) Intake/Output from previous day: 06/16 0701 - 06/17 0700 In: 285 [I.V.:185; IV Piggyback:100] Out: 375 [Urine:375] Intake/Output from this shift: Total I/O In: 450 [I.V.:450] Out: 300 [Urine:300]  Labs:  Recent Labs  10/06/2014 2257 10/23/2014 2307  WBC  --  18.9*  HGB  --  13.7  PLT  --  268  CREATININE 0.64  --    Estimated Creatinine Clearance: 40.1 mL/min (by C-G formula based on Cr of 0.64). No results for input(s): VANCOTROUGH, VANCOPEAK, VANCORANDOM, GENTTROUGH, GENTPEAK, GENTRANDOM, TOBRATROUGH, TOBRAPEAK, TOBRARND, AMIKACINPEAK, AMIKACINTROU, AMIKACIN in the last 72 hours.   Microbiology: Recent Results (from the past 720 hour(s))  Blood culture (routine x 2)     Status: None   Collection Time: 09/20/14  4:20 PM  Result Value Ref Range Status   Specimen Description BLOOD  Final   Special Requests NONE  Final   Culture NO GROWTH 5 DAYS  Final   Report Status 09/25/2014 FINAL  Final  Blood culture (routine x 2)     Status: None   Collection Time: 09/20/14  8:00 PM  Result Value Ref Range Status   Specimen Description BLOOD  Final   Special Requests NONE  Final   Culture NO GROWTH 5 DAYS  Final   Report Status 09/25/2014 FINAL  Final  MRSA PCR Screening     Status: None   Collection Time: 09/21/14  3:45 AM  Result Value Ref Range Status   MRSA by PCR NEGATIVE NEGATIVE Final    Comment:         The GeneXpert MRSA Assay (FDA approved for NASAL specimens only), is one component of a comprehensive MRSA colonization surveillance program. It is not intended to diagnose MRSA infection nor to guide or monitor treatment for MRSA infections.   Urine culture     Status: None   Collection Time: 09/21/14 11:02 AM  Result Value Ref Range Status   Specimen Description URINE, RANDOM  Final   Special Requests NONE  Final   Culture NO GROWTH 2 DAYS  Final   Report Status 09/23/2014 FINAL  Final  C difficile quick scan w PCR reflex [facility])     Status: None   Collection Time: 09/23/14  7:33 AM  Result Value Ref Range Status   C Diff antigen NEGATIVE  Final   C Diff toxin NEGATIVE  Final   C Diff interpretation Negative for C. difficile  Final  Blood culture (routine x 2)     Status: None (Preliminary result)   Collection Time: 10/09/2014 10:49 PM  Result Value Ref Range Status   Specimen Description BLOOD  Final   Special Requests Normal  Final   Culture NO GROWTH < 12 HOURS  Final   Report Status PENDING  Incomplete  Blood culture (routine x 2)     Status: None (Preliminary result)   Collection Time: 10/11/2014 10:49 PM  Result Value Ref Range Status  Specimen Description BLOOD  Final   Special Requests Normal  Final   Culture NO GROWTH < 12 HOURS  Final   Report Status PENDING  Incomplete  MRSA PCR Screening     Status: None   Collection Time: 2014/11/07 10:49 PM  Result Value Ref Range Status   MRSA by PCR NEGATIVE NEGATIVE Final    Comment:        The GeneXpert MRSA Assay (FDA approved for NASAL specimens only), is one component of a comprehensive MRSA colonization surveillance program. It is not intended to diagnose MRSA infection nor to guide or monitor treatment for MRSA infections. NEGATIVE     Medical History: Past Medical History  Diagnosis Date  . Atrial fibrillation   . Dementia   . Gastritis   . Anemia   . Depression   . Aspiration into airway      Medications:  Prescriptions prior to admission  Medication Sig Dispense Refill Last Dose  . Carboxymethylcellulose Sodium (REFRESH LIQUIGEL OP) Apply 1 application to eye at bedtime.    10/11/2014 at Unknown time  . carvedilol (COREG) 3.125 MG tablet Take 1 tablet (3.125 mg total) by mouth 2 (two) times daily with a meal. 60 tablet 0 07-Nov-2014 at 0800  . cholecalciferol (VITAMIN D) 1000 UNITS tablet Take 1,000 Units by mouth daily.   November 07, 2014 at Unknown time  . furosemide (LASIX) 20 MG tablet Take 1 tablet (20 mg total) by mouth daily. (Patient taking differently: Take 20 mg by mouth daily as needed for edema. ) 30 tablet 0 Past Week at Unknown time  . ipratropium-albuterol (DUONEB) 0.5-2.5 (3) MG/3ML SOLN Take 3 mLs by nebulization every 6 (six) hours as needed (wheezing). (Patient taking differently: Take 3 mLs by nebulization every 6 (six) hours as needed (for wheezing). ) 360 mL 0 PRN at PRN  . nystatin (MYCOSTATIN) 100000 UNIT/ML suspension Take 5 mLs by mouth 4 (four) times daily.   Nov 07, 2014 at Unknown time  . Polyethyl Glycol-Propyl Glycol 0.4-0.3 % SOLN Apply 1 drop to eye 2 (two) times daily.   2014-11-07 at Unknown time  . sertraline (ZOLOFT) 50 MG tablet Take 50 mg by mouth at bedtime.   10/11/2014 at Unknown time  . warfarin (COUMADIN) 2 MG tablet Take 1 tablet (2 mg total) by mouth daily. 30 tablet 2 10/11/2014 at Unknown time  . guaiFENesin (MUCINEX) 600 MG 12 hr tablet Take 1 tablet (600 mg total) by mouth 2 (two) times daily. (Patient not taking: Reported on 2014-11-07) 10 tablet 0   . Ipratropium-Albuterol (COMBIVENT RESPIMAT) 20-100 MCG/ACT AERS respimat Inhale 1 puff into the lungs every 6 (six) hours. (Patient not taking: Reported on Nov 07, 2014) 1 Inhaler 1   . levofloxacin (LEVAQUIN) 500 MG tablet Take 1 tablet (500 mg total) by mouth daily. (Patient not taking: Reported on 11/07/14) 5 tablet 0    Assessment: UTI  Goal of Therapy:  resolution of infection  Plan:   Expected duration 10 days with resolution of temperature and/or normalization of WBC  Will start Levaquin 250 mg IV Q24H.   Lakeria Starkman D 10/13/2014,4:29 PM

## 2014-10-13 NOTE — Progress Notes (Addendum)
ANTICOAGULATION CONSULT NOTE - Initial Consult  Pharmacy Consult for warfarin dosing Indication: atrial fibrillation  Allergies  Allergen Reactions  . Amoxicillin Itching  . Doxycycline Other (See Comments)    Reaction: Unknown  . Polytrim [Polymyxin B-Trimethoprim] Other (See Comments)    Reaction:  Eye irritation   . Tolmetin Itching    Patient Measurements: Height: 5\' 4"  (162.6 cm) Weight: 120 lb (54.432 kg) IBW/kg (Calculated) : 54.7 Heparin Dosing Weight: n/a  Vital Signs: Temp: 98.6 F (37 C) (06/16 2305) Temp Source: Oral (06/16 2305) BP: 146/73 mmHg (06/17 0400) Pulse Rate: 101 (06/17 0400)  Labs:  Recent Labs  10/23/2014 2257 09/28/2014 2307  HGB  --  13.7  HCT  --  42.8  PLT  --  268  CREATININE 0.64  --   TROPONINI 0.03  --     Estimated Creatinine Clearance: 40.1 mL/min (by C-G formula based on Cr of 0.64).   Medical History: Past Medical History  Diagnosis Date  . Atrial fibrillation   . Dementia   . Gastritis   . Anemia   . Depression   . Aspiration into airway     Medications:    Assessment: INR 2.27   Goal of Therapy:  INR 2-3    Plan:  Home dose of 2 mg daily ordered. Pharmacy to follow and manage.  Nakia Koble S 10/13/2014,5:06 AM    6/18 INR 2.91. Decrease to 1 mg daily. INR in AM. 6/19 INR 2.81 6/20 INR 3.37 hold x1 dose.   Fulton Reek, PharmD, BCPS  20-Oct-2014

## 2014-10-13 NOTE — Telephone Encounter (Signed)
noted 

## 2014-10-13 NOTE — Progress Notes (Signed)
[facility] [place] [facility]   PATIENT NAME: Jamie Norton    MR#:  [id number]  DATE OF BIRTH:  1921/08/05  SUBJECTIVE:  CHIEF COMPLAINT:   Chief Complaint  Patient presents with  . Shortness of Breath   No complains now. REVIEW OF SYSTEMS:  CONSTITUTIONAL: No fever, fatigue or weakness.  EYES: No blurred or double vision.  EARS, NOSE, AND THROAT: No tinnitus or ear pain.  RESPIRATORY: No cough, mild shortness of breath, no wheezing or hemoptysis.  CARDIOVASCULAR: No chest pain, orthopnea, edema.  GASTROINTESTINAL: No nausea, vomiting, diarrhea or abdominal pain.  GENITOURINARY: No dysuria, hematuria.  ENDOCRINE: No polyuria, nocturia,  HEMATOLOGY: No anemia, easy bruising or bleeding SKIN: No rash or lesion. MUSCULOSKELETAL: No joint pain or arthritis.   NEUROLOGIC: No tingling, numbness, weakness.  PSYCHIATRY: No anxiety or depression.   ROS  DRUG ALLERGIES:   Allergies  Allergen Reactions  . Amoxicillin Itching  . Doxycycline Other (See Comments)    Reaction: Unknown  . Polytrim [Polymyxin B-Trimethoprim] Other (See Comments)    Reaction:  Eye irritation   . Tolmetin Itching    VITALS:  Blood pressure 135/47, pulse 83, temperature 98.6 F (37 C), temperature source Oral, resp. rate 18, height  (1.626 m), weight 54.432 kg (120 lb), SpO2 97 %.  PHYSICAL EXAMINATION:  GENERAL:  81 y.o.-year-old patient lying in the bed with no acute distress.  EYES: Pupils equal, round, reactive to light and accommodation. No scleral icterus. Extraocular muscles intact.  HEENT: Head atraumatic, normocephalic. Oropharynx and nasopharynx clear.  NECK:  Supple, no jugular venous distention. No thyroid enlargement, no tenderness.  LUNGS: Normal breath sounds bilaterally, no wheezing, mild rales or crepitation. No use of accessory muscles of respiration. Uses nasal canula oxygen. CARDIOVASCULAR: S1, S2 normal. No murmurs, rubs, or gallops.   ABDOMEN: Soft, nontender, nondistended. Bowel sounds present. No organomegaly or mass.  EXTREMITIES: No pedal edema, cyanosis, or clubbing. Some echymosis present- might be due to old age. NEUROLOGIC: Cranial nerves II through XII are intact. Muscle strength 5/5 in all extremities. Sensation intact. Gait not checked.  PSYCHIATRIC: The patient is alert and oriented x 3.  SKIN: No obvious rash, lesion, or ulcer.   Physical Exam LABORATORY PANEL:   CBC  Recent Labs Lab 09/27/2014 2307  WBC 18.9*  HGB 13.7  HCT 42.8  PLT 268   ------------------------------------------------------------------------------------------------------------------  Chemistries   Recent Labs Lab 10/22/2014 2257  NA 135  K 4.3  CL 100*  CO2 29  GLUCOSE 160*  BUN 26*  CREATININE 0.64  CALCIUM 8.4*  AST 43*  ALT 36  ALKPHOS 167*  BILITOT 0.6   ------------------------------------------------------------------------------------------------------------------  Cardiac Enzymes  Recent Labs Lab 10/09/2014 2257  TROPONINI 0.03   ------------------------------------------------------------------------------------------------------------------  RADIOLOGY:  Dg Chest Portable 1 View  10/17/2014   CLINICAL DATA:  Acute onset of shortness of breath. Initial encounter.  EXAM: PORTABLE CHEST - 1 VIEW  COMPARISON:  Chest radiograph performed 09/26/2014  FINDINGS: The lungs are well-aerated. A residual small to moderate left-sided pleural effusion is noted, improved from the prior study. Bilateral airspace opacification is also mildly improved, and likely reflects improving pneumonia or ARDS.  The cardiomediastinal silhouette is borderline normal in size. No acute osseous abnormalities are seen.  IMPRESSION: Residual small to moderate left-sided pleural effusion is improved from the prior study. Bilateral airspace opacification is also mildly improved, and likely reflects improving pneumonia or ARDS.   Electronically  Signed   By: Leotis Shames  Chang M.D.   On: Oct 25, 2014 23:43    ASSESSMENT AND PLAN:    1.Sepsis, meeting septic criteria by heart rate, leukocytosis, respiratory rate present on arrival.   Source likely pulmonary. It may be episode of aspiration- as it happened right after eating.  Xray chest- clear, Blood cx negative, Urine- have few WBCs- vitals now stable.  Started on  Broad-spectrum antibiotics including vancomycin/Zosyn   Given IV fluid hydration to keep mean arterial pressure greater than 65.  will change Abx to just Levaquin to cover for UTI- and transfer to medical floor.  2. Acute on chronic respi failure with hypoxia: Secondary to aspiration pneumonitis/pneumonia, continue supplemental O2 to keep SaO2 greater than 92%,    Initially required BiPAP therapy now on nasal canula oxygen , DuoNeb treatments every 4 hours   Will get swallow eval.  3. Atrial fibrillation, rapid ventricular response: Goal heart rate less than 120, IV Cardizem if required   HR under control now, anticoagulated with coumadin. 4. Dysphagia: Place nothing by mouth will require speech evaluation she was already on dysphagia 3 diet 5. Venous thromboembolism prophylactic- on therapeutic warfarin  Will get PT eval.  All the records are reviewed and case discussed with Care Management/Social Workerr. Management plans discussed with the patient, family and they are in agreement.  CODE STATUS: DNR  TOTAL TIME TAKING CARE OF THIS PATIENT: 35 minutes.   More than 50% of the visit was spent in counseling/coordination of care  POSSIBLE D/C IN 1-2 DAYS, DEPENDING ON CLINICAL CONDITION.   Altamese Dilling M.D on 10/13/2014   Between 7am to 6pm - Pager - [phone]  After 6pm go to [email or web address] - password [email or web address] [facility]  [place] [facility]  Office  [phone]  CC: Primary care physician; No primary care provider on file.

## 2014-10-13 NOTE — Care Management Note (Signed)
Case Management Note  Patient Details  Name: Jamie Norton MRN: [id number] Date of Birth: [birth year]  Subjective/Objective:  Presents from [facility]. Recently at [facility] for chronic respiratory failure. Returns now with sepsis, dysphagia, asp PNA. CSW made aware pt is from [facility].                     Action/Plan:   Expected Discharge Date:                  Expected Discharge Plan:  Skilled Nursing Facility  In-House Referral:  Clinical Social Work  Discharge planning Services     Post Acute Care Choice:    Choice offered to:     DME Arranged:    DME Agency:     HH Arranged:    HH Agency:     Status of Service:     Medicare Important Message Given:  Yes Date Medicare IM Given:  10/13/14 Medicare IM give by:  Gweneth Dimitri Date Additional Medicare IM Given:    Additional Medicare Important Message give by:     If discussed at Long Length of Stay Meetings, dates discussed:    Additional Comments:  Marily Memos, RN 10/13/2014, 12:14 PM

## 2014-10-13 NOTE — ED Notes (Addendum)
Dr brown in with pt and family.  bipap in place.  Pt awake and talking.  Skin warm and dry.  2 iv's in place.

## 2014-10-13 NOTE — Evaluation (Signed)
Clinical/Bedside Swallow Evaluation Patient Details  Name: Jamie Norton MRN: [id number] Date of Birth: [birth year]  Today's Date: 10/13/2014 Time: SLP Start Time (ACUTE ONLY): 1530 SLP Stop Time (ACUTE ONLY): 1630 SLP Time Calculation (min) (ACUTE ONLY): 60 min  Past Medical History:  Past Medical History  Diagnosis Date  . Atrial fibrillation   . Dementia   . Gastritis   . Anemia   . Depression   . Aspiration into airway    Past Surgical History: History reviewed. No pertinent past surgical history. HPI:  pt has been continuing a Dys. 3 oral diet w/ aspiration precautions per report. Pt acknowledged she has had "swallowing problems for a while". She continues to present w/ intermittent s/s of aspiration durig meals despite precautions. During last admission 09/20/14, she reported it was hard to swallow pillows. She had a MBSS at that time in which she exhibited moderate pharyngeal phase dysphagia and moderate+ Esophageal phase dysmotility w/ the presentation of slow motility and clearing which could be impacting the pharyngeal phase and the ability for food/liquid to clear the pharynx thus increasing risk for increased pharyngeal residue to remain and laryngeal penetration/aspiration of such to occur. She was rec'd to be on a Dys. II diet w/ thin liquids w/ strict aspiration precautions and throat clearing strategy during/post meals to aid airway protection.    Assessment / Plan / Recommendation Clinical Impression  Pt presented w/ inconsistent, overtly mild throat clearing w/ all trial consistencies during the pharyngeal phase of swallow; the throat clearing was noted at rest prior to po trials(appeared to be phlegm) and did not increase w/ the continued po trials but was present. No signficiant oral phase deficits noted w/ the trials given - no solids were given requiring mastication. Given time, and as she alternateed w/ small sips of liquids w/ the puree trials, pt cleared orally b/t  trials/boluses. She appeared to use the small sip of liquid consistently to aid swallowing/clearing; pt stated this was baseline for her to fully swallow her foods. No overt coughing was noted; O2 sats remained in the high 90's w/ no decilne/change in baseline HR/RR during/post po trials. Pt fed self w/ setup and assistance. Pt stated she did not have much appetite("never really hungry"). Pt would benefit from a Dys. 1 diet w/ thin liquids for pharyngeal and Esophageal clearing. Rec. aspiration precautions; tray setup at meals. Meds in Puree - crushed as able. Suspect pt's Esophageal phase deficits and dysmotility are impacting her safety of oral intake during meals as these aspiration episodes are occuring during meals but even w/ foods such as Pudding(a puree consistency). Pt and family would benefit from a Palliative Care consult for further education on pt's presentation; GI consult potentially for education.     Aspiration Risk  Moderate    Diet Recommendation Dysphagia 1 (Puree);Thin   Medication Administration: Crushed with puree Compensations: Small sips/bites;Check for pocketing;Multiple dry swallows after each bite/sip;Clear throat intermittently    Other  Recommendations Recommended Consults: Consider GI evaluation (palliative care consult; dietician consult) Oral Care Recommendations: Oral care BID;Oral care before and after PO;Staff/trained caregiver to provide oral care   Follow Up Recommendations       Frequency and Duration min 3x week  1 week   Pertinent Vitals/Pain denied    SLP Swallow Goals  see care plan   Swallow Study Prior Functional Status       General Date of Onset: 2014-10-29 Other Pertinent Information: pt has been continuing a Dys. 3 oral  diet w/ aspiration precautions per report. Pt acknowledged she has had "swallowing problems for a while". She continues to present w/ intermittent s/s of aspiration durig meals despite precautions. During last admission  09/20/14, she reported it was hard to swallow pillows. She had a MBSS at that time in which she exhibited moderate pharyngeal phase dysphagia and moderate+ Esophageal phase dysmotility w/ the presentation of slow motility and clearing which could be impacting the pharyngeal phase and the ability for food/liquid to clear the pharynx thus increasing risk for increased pharyngeal residue to remain and laryngeal penetration/aspiration of such to occur. She was rec'd to be on a Dys. II diet w/ thin liquids w/ strict aspiration precautions and throat clearing strategy during/post meals to aid airway protection.  Type of Study: Bedside swallow evaluation Previous Swallow Assessment:  (MBSS during last admission on 09/25/14) Diet Prior to this Study: Dysphagia 3 (soft);Thin liquids (per report; a Dys. 2 w/ thin liquids was rec'd at last d/c.) Temperature Spikes Noted: No (WBC 18.9 (abnormal)) Respiratory Status: Supplemental O2 delivered via (comment) History of Recent Intubation: No (BiPAP at admission) Behavior/Cognition: Alert;Cooperative;Pleasant mood;Confused;Requires cueing;Distractible Oral Cavity - Dentition: Adequate natural dentition/normal for age Self-Feeding Abilities: Able to feed self;Needs assist;Needs set up Patient Positioning: Upright in bed Baseline Vocal Quality: Low vocal intensity;Normal Volitional Cough: Strong Volitional Swallow: Able to elicit    Oral/Motor/Sensory Function Overall Oral Motor/Sensory Function: Appears within functional limits for tasks assessed Labial ROM: Within Functional Limits Labial Symmetry: Within Functional Limits Labial Strength: Within Functional Limits Lingual ROM: Within Functional Limits Lingual Symmetry: Within Functional Limits Lingual Strength: Within Functional Limits Facial Symmetry: Within Functional Limits Mandible: Within Functional Limits   Ice Chips Ice chips: Impaired Presentation: Self Fed (single ice chips x5; mild throat clearing  intermittently ) Oral Phase Impairments:  (none) Oral Phase Functional Implications:  (none) Pharyngeal Phase Impairments: Throat Clearing - Delayed (mild) Other Comments:  (not different from her baseline throat clearing prior to po')   Thin Liquid Thin Liquid: Impaired Presentation: Self Fed;Cup (x5 trials) Oral Phase Impairments:  (none ) Oral Phase Functional Implications:  (none) Pharyngeal  Phase Impairments: Throat Clearing - Delayed (mild; same as w/ ice chip trials)    Nectar Thick Nectar Thick Liquid: Not tested   Honey Thick Honey Thick Liquid: Not tested   Puree Puree: Impaired Presentation: Self Fed;Spoon (10 trials total of applesauce and ice cream) Oral Phase Impairments:  (none) Oral Phase Functional Implications:  (none) Pharyngeal Phase Impairments: Throat Clearing - Delayed (mild; same as w/ trials of ice chips)   Solid   GO    Solid: Not tested       Arneisha Kincannon 10/13/2014,5:28 PM

## 2014-10-14 LAB — BASIC METABOLIC PANEL
Anion gap: 8 (ref 5–15)
BUN: 21 mg/dL — ABNORMAL HIGH (ref 6–20)
CALCIUM: 7.8 mg/dL — AB (ref 8.9–10.3)
CO2: 28 mmol/L (ref 22–32)
Chloride: 103 mmol/L (ref 101–111)
Creatinine, Ser: 0.59 mg/dL (ref 0.44–1.00)
GFR calc Af Amer: >60 mL/min (ref >60)
GFR calc non Af Amer: >60 mL/min (ref >60)
GLUCOSE: 83 mg/dL (ref 65–99)
Potassium: 3.4 mmol/L — ABNORMAL LOW (ref 3.5–5.1)
Sodium: 139 mmol/L (ref 135–145)

## 2014-10-14 LAB — CBC
HEMATOCRIT: 32.1 % — AB (ref 35.0–47.0)
HEMOGLOBIN: 10.5 g/dL — AB (ref 12.0–16.0)
MCH: 31.7 pg (ref 26.0–34.0)
MCHC: 32.8 g/dL (ref 32.0–36.0)
MCV: 96.8 fL (ref 80.0–100.0)
Platelets: 170 10*3/uL (ref 150–440)
RBC: 3.32 MIL/uL — AB (ref 3.80–5.20)
RDW: 14.4 % (ref 11.5–14.5)
WBC: 9.4 10*3/uL (ref 3.6–11.0)

## 2014-10-14 LAB — PROTIME-INR
INR: 2.91
Prothrombin Time: 30.5 seconds — ABNORMAL HIGH (ref 11.4–15.0)

## 2014-10-14 MED ORDER — WARFARIN SODIUM 1 MG PO TABS
1.0000 mg | ORAL_TABLET | Freq: Every day | ORAL | Status: DC
  Administered 2014-10-14 – 2014-10-15 (×2): 1 mg via ORAL
  Filled 2014-10-14 (×3): qty 1

## 2014-10-14 MED ORDER — POLYVINYL ALCOHOL 1.4 % OP SOLN
1.0000 [drp] | Freq: Two times a day (BID) | OPHTHALMIC | Status: DC
  Administered 2014-10-14 – 2014-10-16 (×8): 1 [drp] via OPHTHALMIC
  Filled 2014-10-14: qty 15

## 2014-10-14 MED ORDER — MORPHINE SULFATE 2 MG/ML IJ SOLN
1.0000 mg | Freq: Four times a day (QID) | INTRAMUSCULAR | Status: DC | PRN
  Administered 2014-10-14: 19:00:00 1 mg via INTRAVENOUS
  Filled 2014-10-14: qty 1

## 2014-10-14 MED ORDER — SODIUM CHLORIDE 0.9 % IV SOLN
INTRAVENOUS | Status: DC
  Administered 2014-10-14 – 2014-10-15 (×2): via INTRAVENOUS

## 2014-10-14 MED ORDER — SODIUM CHLORIDE 0.9 % IJ SOLN
3.0000 mL | INTRAMUSCULAR | Status: DC | PRN
  Administered 2014-10-14: 3 mL via INTRAVENOUS
  Filled 2014-10-14: qty 10

## 2014-10-14 MED ORDER — LEVOFLOXACIN 250 MG PO TABS
250.0000 mg | ORAL_TABLET | Freq: Every day | ORAL | Status: DC
  Administered 2014-10-14 – 2014-10-15 (×2): 250 mg via ORAL
  Filled 2014-10-14 (×2): qty 1

## 2014-10-14 MED ORDER — IPRATROPIUM-ALBUTEROL 0.5-2.5 (3) MG/3ML IN SOLN
3.0000 mL | RESPIRATORY_TRACT | Status: DC | PRN
  Administered 2014-10-14: 14:00:00 3 mL via RESPIRATORY_TRACT
  Filled 2014-10-14: qty 3

## 2014-10-14 NOTE — Progress Notes (Addendum)
Pt had another episode of desating with 02 adjusted up to 100% NRBM applied with respiratory in room. Pt denies pain. Weaker, congested nonproductive cough. Dr. Elisabeth Pigeon called- updated on respiratory status, vs's, actions taken with limited response. Pt alert, aware and cooperative. Assisted back to bed with 2+. Pt agreeable for morphine to aide dysnea with this given. Denies pain/nausea. Color pale; skin warm and dry. Renae Fickle, son, returned and updated. Dr.Vachhani spoke with son, Renae Fickle on phone.

## 2014-10-14 NOTE — Clinical Social Work Note (Signed)
Clinical Social Work Assessment  Patient Details  Name: Jamie Norton MRN: [id number] Date of Birth: [birth year]  Date of referral:  10/13/14               Reason for consult:  Facility Placement                Permission sought to share information with:  Family Supports Permission granted to share information::  Yes, Verbal Permission Granted  Name::     Jamie Norton  Agency::  [facility]  Relationship::  Sons  Contact Information:  [phone]   Housing/Transportation Living arrangements for the past 2 months:  [facility], [place] of Information:  Patient Patient Interpreter Needed:  None Criminal Activity/Legal Involvement Pertinent to Current Situation/Hospitalization:    Significant Relationships:  Adult Children, Warehouse manager, Industrial/product designer Lives with:  Facility Resident Do you feel safe going back to the place where you live?  Yes Need for family participation in patient care:  Yes (Comment)  Care giving concerns:  Pt will need SNF at DC.    Social Worker assessment / plan:  CSW met with Pt and her son from [place]. 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  Her son Jamie Norton is her 33 and is the only son who lives locally. Pt reports living at [facility] for 30 years and from family reports, she has always been quiet active and very social in that community.  Pt shares that she has been in ALF for 2 years. Pt went to SNF at [facility] after last admission and then went back to ALF. Pt will likely need to go to SNF again at dc and is agreeable to that plan. Pt's HCPOA is out of town until Tuesday. All of patient's sons will be in town to see patient next weekend. CSW will update Jamie Norton as Pt's care progresses.   Employment status:  Retired Forensic scientist:  Medicare PT Recommendations:  Not assessed at this time Jamie Norton / Referral to community resources:  Jamie Norton  Patient/Family's Response to care:  Pt able to participate in [employer], recite son's phone numbers and answer CSW questions.   Patient/Family's Understanding of and Emotional Response to Diagnosis, Current Treatment, and Prognosis:  Pt's daughter-in-law shared that Pt has been very reserved which is concerning to family since they report that she is very social normally.  CSW asked Pt's family if Pt has ever been seriously ill and they said no. CSW suggested that Pt might need some support as she adjusts to her illness.  Pt pleasant with CSW, able to follow conversation and smiled during conversation.   Emotional Assessment Appearance:  Appears stated age Attitude/Demeanor/Rapport:   (calm, cooperative) Affect (typically observed):  Accepting Orientation:  Oriented to Self, Oriented to Place, Oriented to  Time, Oriented to Situation Alcohol / Substance use:  Never Used Psych involvement (Current and /or in the community):  No (Comment)  Discharge Needs  Concerns to be addressed:  No discharge needs identified Readmission within the last 30 days:  Yes Current discharge risk:  Chronically ill Barriers to Discharge:  No Barriers Identified   Jamie Buckler, LCSW 10/14/2014, 4:51 PM

## 2014-10-14 NOTE — Progress Notes (Signed)
Son, Renae Fickle, from [place] in to visit with pt and updated with password obtained. He reports his brother, Dorinda Hill is HPOA, and [place].

## 2014-10-14 NOTE — Evaluation (Signed)
Physical Therapy Evaluation Patient Details Name: Jamie Norton MRN: [id number] DOB: 02-28-1922 Today's Date: 10/14/2014   History of Present Illness  Pt is admitted for pneumonia. Pt with history of Afib, dementia, and anemia. Recently at [facility] discharge 09/22/14 discharge diagnosis aspiration pneumonia, acute on chronic respiratory failure. At that time she was discharged to nursing facility ever since she has been on 2 L nasal cannula oxygen at baseline. She was in her usual state of health however, after eating dinner she was noticed to have respiratory distress. She is brought to the hospital for further workup and evaluation, required BiPAP therapy to maintain oxygen saturations in ease work of breathing. She is currently unable to provide meaningful information given mental status/medical condition history obtained from family present at bedside.  Clinical Impression  Pt is received sitting up in chair upon entry, awake, alert, and willing to participate. Pt is A&Ox3 and pleasant. Pt strength as screened by MMT is 5/5 throughout with mildly weak grip strength, but is generally weak in upper and lower quarters as noted during functional assessment during mobility, although likely close to baseline based on pt report. Pt falls risk is high as evidenced by slow gait speed, poor forward reach, and altered posturing during transfers and mobility. Pt remaining on 3L O2 throughout evaluation, but desaturating with activity (92%) as well as mobility (84%), whereas pt is not on O2 at baseline at home. Patient presenting with impairment of strength, range of motion, balance, oxygen perfusion, and activity tolerance, limiting ability to perform ADL and mobility tasks at  baseline level of function. Patient will benefit from skilled intervention to address the above impairments and limitations, in order to restore to prior level of function, improve patient safety upon discharge,  and to decrease falls risk.       Follow Up Recommendations Supervision for mobility/OOB (Return to [facility] with Home Health PT)    Equipment Recommendations  None recommended by PT    Recommendations for Other Services       Precautions / Restrictions Precautions Precautions: Fall Restrictions Weight Bearing Restrictions: No Other Position/Activity Restrictions: Use O2 with activity      Mobility  Bed Mobility Overal bed mobility:  (Received in sitting.)                Transfers Overall transfer level: Needs assistance Equipment used: Rolling walker (2 wheeled) Transfers: Sit to/from Stand Sit to Stand: Supervision         General transfer comment: Pt desatting with activity: 92% SaO2 on 3L c MMT while seated.   Ambulation/Gait Ambulation/Gait assistance: Supervision Ambulation Distance (Feet): 160 Feet Assistive device: Rolling walker (2 wheeled) Gait Pattern/deviations: Step-through pattern;Trunk flexed Gait velocity: 1.76ft/sec or 0.38m/s Gait velocity interpretation: <1.8 ft/sec, indicative of risk for recurrent falls General Gait Details: Pt immediately taking off at a quick gait speed adn cadence on 3L O2, suddenly stopping at around 181ft c/o SOB, whence SaO2:84%. Pt given 60 seconds of nasal breathing to recover, after which SaO2 resolved to 91% on 3L and continued to AMB return to room. Walker positioned anteriorly, beyond safe distance. Walker positioned anteriorly, beyond safe distance. Walker positioned anteriorly, beyond safe distance.   Stairs            Wheelchair Mobility    Modified Rankin (Stroke Patients Only)       Balance Overall balance assessment: Modified Independent (Eyesclosed for 10 sec ok; FWD reach: 3 inches. )  Pertinent Vitals/Pain Pain Assessment: No/denies pain    Home Living Family/patient expects to be discharged to:: Assisted living                Home Equipment: Walker - 2 wheels      Prior Function Level of Independence: Independent with assistive device(s)               Hand Dominance   Dominant Hand: Right    Extremity/Trunk Assessment   Upper Extremity Assessment: Generalized weakness;LUE deficits/detail       LUE Deficits / Details: Pt demonstrating inability to perform DIP or PIP flexion with griping activity, but able to maintain lumbrical (MCP) grip with RW.    Lower Extremity Assessment: Generalized weakness (MMT reveals 5/5 strength in BLE, but functional strength for transfers is moderately limited. )      Cervical / Trunk Assessment: Kyphotic (forward flexed posture. )  Communication   Communication: No difficulties  Cognition Arousal/Alertness: Awake/alert (Oriented x3. ) Behavior During Therapy: WFL for tasks assessed/performed;Flat affect Overall Cognitive Status: Within Functional Limits for tasks assessed                      General Comments General comments (skin integrity, edema, etc.): Bilat fluid accumulation distal anterior lowerleg, with translucent skin. Pt reports as chronic problem.     Exercises        Assessment/Plan    PT Assessment Patient needs continued PT services  PT Diagnosis Difficulty walking;Generalized weakness;Abnormality of gait   PT Problem List Decreased activity tolerance;Decreased strength;Decreased safety awareness;Decreased balance  PT Treatment Interventions Gait training;DME instruction;Functional mobility training;Therapeutic activities;Therapeutic exercise;Patient/family education;Balance training   PT Goals (Current goals can be found in the Care Plan section) Acute Rehab PT Goals Patient Stated Goal: to go back to [facility] PT Goal Formulation: With patient Time For Goal Achievement: 10/28/14 Potential to Achieve Goals: Good    Frequency Min 2X/week   Barriers to discharge        Co-evaluation               End of  Session Equipment Utilized During Treatment: Oxygen;Gait belt Activity Tolerance: Patient limited by fatigue Patient left: in chair;with call bell/phone within reach;with chair alarm set Nurse Communication: Mobility status (desaturation with exersion. )         Time: 1610-9604 PT Time Calculation (min) (ACUTE ONLY): 22 min   Charges:   PT Evaluation $Initial PT Evaluation Tier I: 1 Procedure     PT G Codes:        Buccola,Allan C 11/03/14, 1:28 PM 1:35 PM  Rosamaria Lints, PT, DPT [place] License # [id number]

## 2014-10-14 NOTE — Progress Notes (Signed)
NA called nurse to room "pt doesn't look good." Pt sitting up in chair with lunch tray in front of her with soup spoon in her hand.  Pt coughing violently with soup down her chin and on her gown with wet congested sound/color pale. Pt advised to cough hard, suction connected with yanuker suctions used with only small amount soup like material and sputum obtained. Pulse ox 84 % on current 02 with 02 increased to 6l/[place] with pulse ox increasing to 93 %. Respiratory called and in to evaluate pt. TC with Dr. Elisabeth Pigeon updated of findings and actions with new orders obtained. Pt continues to do effective coughing. Diet discontinued. Staffed with speech with her advising pt has esophageal motility problems with foods coming back up and this problem is being addressed. Pt condition improving-will continue to monitor.

## 2014-10-14 NOTE — Progress Notes (Signed)
Speech Therapy Note: NSG consulted this SLP re: pt's difficulty w/ management of po's today w/ decreased O2 sats; overt dysphagia and suctioning given. NSG had informed MD and now pt is NPO. Updated NSG on pt's dysphagia and nature of Esophageal dysphagia. Agreed w/ NPO status and consulted MD re: Palliative Care consult on Monday. MD agreed. ST will f/u w/ pt's status on Monday. Rec. Continued oral care d/t NPO status.

## 2014-10-14 NOTE — Progress Notes (Signed)
[facility] [place] [facility]   PATIENT NAME: Jamie Norton    MR#:  [id number]  DATE OF BIRTH:  26-Mar-1922  SUBJECTIVE:  CHIEF COMPLAINT:   Chief Complaint  Patient presents with  . Shortness of Breath  no complains today.  REVIEW OF SYSTEMS:  CONSTITUTIONAL: No fever, fatigue or weakness.  EYES: No blurred or double vision.  EARS, NOSE, AND THROAT: No tinnitus or ear pain.  RESPIRATORY: No cough, mild shortness of breath, no wheezing or hemoptysis.  CARDIOVASCULAR: No chest pain, orthopnea, edema.  GASTROINTESTINAL: No nausea, vomiting, diarrhea or abdominal pain.  GENITOURINARY: No dysuria, hematuria.  ENDOCRINE: No polyuria, nocturia,  HEMATOLOGY: No anemia, easy bruising or bleeding SKIN: No rash or lesion. MUSCULOSKELETAL: No joint pain or arthritis.   NEUROLOGIC: No tingling, numbness, weakness.  PSYCHIATRY: No anxiety or depression.   ROS  DRUG ALLERGIES:   Allergies  Allergen Reactions  . Amoxicillin Itching  . Doxycycline Other (See Comments)    Reaction: Unknown  . Polytrim [Polymyxin B-Trimethoprim] Other (See Comments)    Reaction:  Eye irritation   . Tolmetin Itching    VITALS:  Blood pressure 154/74, pulse 79, temperature 98 F (36.7 C), temperature source Oral, resp. rate 18, height  (1.626 m), weight 54.432 kg (120 lb), SpO2 98 %.  PHYSICAL EXAMINATION:  GENERAL:  81 y.o.-year-old patient lying in the bed with no acute distress.  EYES: Pupils equal, round, reactive to light and accommodation. No scleral icterus. Extraocular muscles intact.  HEENT: Head atraumatic, normocephalic. Oropharynx and nasopharynx clear.  NECK:  Supple, no jugular venous distention. No thyroid enlargement, no tenderness.  LUNGS: Normal breath sounds bilaterally, no wheezing, mild rales or crepitation. No use of accessory muscles of respiration. Uses nasal canula oxygen. CARDIOVASCULAR: S1, S2 normal. No murmurs, rubs, or gallops.   ABDOMEN: Soft, nontender, nondistended. Bowel sounds present. No organomegaly or mass.  EXTREMITIES: No pedal edema, cyanosis, or clubbing. Some echymosis present- might be due to old age. NEUROLOGIC: Cranial nerves II through XII are intact. Muscle strength 5/5 in all extremities. Sensation intact. Gait not checked.  PSYCHIATRIC: The patient is alert and oriented x 3.  SKIN: No obvious rash, lesion, or ulcer.   Physical Exam LABORATORY PANEL:   CBC  Recent Labs Lab 10/14/14 0454  WBC 9.4  HGB 10.5*  HCT 32.1*  PLT 170   ------------------------------------------------------------------------------------------------------------------  Chemistries   Recent Labs Lab 10/01/2014 2257 10/14/14 0454  NA 135 139  K 4.3 3.4*  CL 100* 103  CO2 29 28  GLUCOSE 160* 83  BUN 26* 21*  CREATININE 0.64 0.59  CALCIUM 8.4* 7.8*  AST 43*  --   ALT 36  --   ALKPHOS 167*  --   BILITOT 0.6  --    ------------------------------------------------------------------------------------------------------------------  Cardiac Enzymes  Recent Labs Lab 10/13/2014 2257  TROPONINI 0.03   ------------------------------------------------------------------------------------------------------------------  RADIOLOGY:  Dg Chest Portable 1 View  10/22/2014   CLINICAL DATA:  Acute onset of shortness of breath. Initial encounter.  EXAM: PORTABLE CHEST - 1 VIEW  COMPARISON:  Chest radiograph performed 09/26/2014  FINDINGS: The lungs are well-aerated. A residual small to moderate left-sided pleural effusion is noted, improved from the prior study. Bilateral airspace opacification is also mildly improved, and likely reflects improving pneumonia or ARDS.  The cardiomediastinal silhouette is borderline normal in size. No acute osseous abnormalities are seen.  IMPRESSION: Residual small to moderate left-sided pleural effusion is improved from the prior study. Bilateral  airspace opacification is also mildly  improved, and likely reflects improving pneumonia or ARDS.   Electronically Signed   By: Roanna Raider M.D.   On: 10-Nov-2014 23:43    ASSESSMENT AND PLAN:    1.Sepsis, meeting septic criteria by heart rate, leukocytosis, elevated respiratory rate present on arrival.   Source likely pulmonary. It may be episode of aspiration- as it happened right after eating.  Xray chest- clear, Blood cx negative, Urine- have few WBCs- vitals now stable.  Started on  Broad-spectrum antibiotics including vancomycin/Zosyn   Given IV fluid hydration to keep mean arterial pressure greater than 65.  changed Abx to just Levaquin to cover for UTI- pt is stable   Appreciated swallow eval- suggested Dysphagia 1 diet.  2. Acute on chronic respi failure with hypoxia: Secondary to aspiration pneumonitis/pneumonia, continue supplemental O2 to keep SaO2 greater than 92%,    Initially required BiPAP therapy now on nasal canula oxygen , DuoNeb treatments every 4 hours  likely episode was aspiration  3. Atrial fibrillation, rapid ventricular response: Goal heart rate less than 120, IV Cardizem if required   HR under control now, anticoagulated with coumadin.  4. Dysphagia: appreciated swallow eval- suggested dysphagia 1 diet.  5. Venous thromboembolism prophylactic- on therapeutic warfarin   PT eval.  All the records are reviewed and case discussed with Care Management/Social Workerr. Management plans discussed with the patient, family and they are in agreement.  CODE STATUS: DNR  TOTAL TIME TAKING CARE OF THIS PATIENT: 35 minutes.   More than 50% of the visit was spent in counseling/coordination of care  POSSIBLE D/C IN 1-2 DAYS, DEPENDING ON CLINICAL CONDITION.   Altamese Dilling M.D on 10/14/2014    Between 7am to 6pm - Pager - [phone]  After 6pm go to [email or web address] - password [email or web address] [facility]  [place]  [facility]  Office  [phone]  CC: Primary care physician; No primary care  provider on file.

## 2014-10-14 NOTE — Plan of Care (Signed)
Problem: Acute Rehab PT Goals(only PT should resolve) Goal: Pt Will Ambulate Pt will ambulate with RW at ModI using a bilat step-through pattern and equal step length for a distances greater than 261ft maintaining SaO2 > 91% to demonstrate the ability to perform safe household distance ambulation at discharge.    Goal: Pt/caregiver will Perform Home Exercise Program Pt will demonstrate independence in performing HEP for 10 min safely to improve balance seated and standing in order to decrease risk of falls.

## 2014-10-14 NOTE — Plan of Care (Signed)
Problem: Discharge Progression Outcomes Goal: Other Discharge Outcomes/Goals Outcome: Progressing 1. Denies pain. No pain meds indicated this shift. 2. Pt congested while trying to eat soup with concern for aspiration with desat of 02 at 85% on 02. Oral suction done; respiratory called and in to assess pt. 02 increased to 6l/[place] with pulse ox returning to normal. Pt performed effective coughing. MD notified with new orders obtained. Pt known to speech therapy. Pt now NPO except crushed meds in applesauce. Will continue to assess.  Changed to po levaquin today. 3. Pt has recurrent swallow difficulties with concern for aspiration due to esphageal motility problems. 4. Unable to tolerate diet-high risk for recurrrent aspiration. 5. Pt sat up in chair/ used BSC with 1+. Ambulated in hall with PT with 02 and did well. 6. Son, Renae Fickle from [place] in and spent the afternoon with pt.

## 2014-10-14 NOTE — Plan of Care (Signed)
Problem: Discharge Progression Outcomes Goal: Other Discharge Outcomes/Goals Outcome: Progressing Patient is alert, confused. Up to [facility] with 1 assist. Lungs remain diminished. Antibiotics continued. Thrush present on tongue, nystatin continued. 3+ edema on bilateral lower extremities.

## 2014-10-15 LAB — PROTIME-INR
INR: 2.81
Prothrombin Time: 29.7 seconds — ABNORMAL HIGH (ref 11.4–15.0)

## 2014-10-15 MED ORDER — IPRATROPIUM-ALBUTEROL 0.5-2.5 (3) MG/3ML IN SOLN
3.0000 mL | Freq: Two times a day (BID) | RESPIRATORY_TRACT | Status: DC
  Administered 2014-10-15 – 2014-10-16 (×2): 3 mL via RESPIRATORY_TRACT
  Filled 2014-10-15 (×2): qty 3

## 2014-10-15 MED ORDER — ACETYLCYSTEINE 10 % IN SOLN
2.0000 mL | Freq: Two times a day (BID) | RESPIRATORY_TRACT | Status: DC
  Filled 2014-10-15 (×2): qty 4

## 2014-10-15 MED ORDER — ACETYLCYSTEINE 20 % IN SOLN
1.0000 mL | Freq: Two times a day (BID) | RESPIRATORY_TRACT | Status: DC
  Administered 2014-10-15 – 2014-10-16 (×2): 1 mL via RESPIRATORY_TRACT
  Filled 2014-10-15 (×5): qty 4

## 2014-10-15 MED ORDER — POTASSIUM CHLORIDE IN NACL 40-0.9 MEQ/L-% IV SOLN
INTRAVENOUS | Status: AC
  Administered 2014-10-15 – 2014-10-16 (×2): 75 mL/h via INTRAVENOUS
  Filled 2014-10-15 (×2): qty 1000

## 2014-10-15 MED ORDER — LEVOFLOXACIN IN D5W 750 MG/150ML IV SOLN
750.0000 mg | INTRAVENOUS | Status: DC
  Filled 2014-10-15: qty 150

## 2014-10-15 MED ORDER — LEVOFLOXACIN IN D5W 500 MG/100ML IV SOLN
500.0000 mg | Freq: Once | INTRAVENOUS | Status: AC
  Administered 2014-10-15: 12:00:00 500 mg via INTRAVENOUS
  Filled 2014-10-15: qty 100

## 2014-10-15 NOTE — Progress Notes (Signed)
Pt alert/orientedx3. Skin warm dry, color pink. Resting quietly. On bipap, breathing without distress. Up to [facility] x1, experienced significant weakness and respiratory distress. Placed on bedpan to void during the rest of shift. Pt denies pain, remained awake throughout most of shift. Inquired if bipap could be removed. Declined morphine for comfort. IV fluids infusing without difficulty. Family at bedside most of evening.

## 2014-10-15 NOTE — Progress Notes (Signed)
[facility] - [place] at [facility]   PATIENT NAME: Jamie Norton    MR#:  [id number]  DATE OF BIRTH:  1921-11-17  SUBJECTIVE:  CHIEF COMPLAINT:   Chief Complaint  Patient presents with  . Shortness of Breath  yesterday after lunch- she started having SOB and hypoxic- likely a new episode of aspiration.   Since then she is on bipap, have some tachypnea. Alert now.  REVIEW OF SYSTEMS:  CONSTITUTIONAL: No fever, fatigue or weakness.  EYES: No blurred or double vision.  EARS, NOSE, AND THROAT: No tinnitus or ear pain.  RESPIRATORY: No cough, severe shortness of breath, no wheezing or hemoptysis.  CARDIOVASCULAR: No chest pain, orthopnea, edema.  GASTROINTESTINAL: No nausea, vomiting, diarrhea or abdominal pain.  GENITOURINARY: No dysuria, hematuria.  ENDOCRINE: No polyuria, nocturia,  HEMATOLOGY: No anemia, easy bruising or bleeding SKIN: No rash or lesion. MUSCULOSKELETAL: No joint pain or arthritis.   NEUROLOGIC: No tingling, numbness, weakness.  PSYCHIATRY: No anxiety or depression.   ROS  DRUG ALLERGIES:   Allergies  Allergen Reactions  . Amoxicillin Itching  . Doxycycline Other (See Comments)    Reaction: Unknown  . Polytrim [Polymyxin B-Trimethoprim] Other (See Comments)    Reaction:  Eye irritation   . Tolmetin Itching    VITALS:  Blood pressure 153/75, pulse 80, temperature 97.8 F (36.6 C), temperature source Oral, resp. rate 22, height  (1.626 m), weight 54.432 kg (120 lb), SpO2 100 %.  PHYSICAL EXAMINATION:  GENERAL:  81 y.o.-year-old patient lying in the bed with  Acute respiratory  distress.  EYES: Pupils equal, round, reactive to light and accommodation. No scleral icterus. Extraocular muscles intact.  HEENT: Head atraumatic, normocephalic. Oropharynx and nasopharynx clear.  NECK:  Supple, no jugular venous distention. No thyroid enlargement, no tenderness.  LUNGS: equal breath sounds bilaterally, no wheezing,positive  crepitation. Positive use of accessory muscles of respiration. Uses bipap. CARDIOVASCULAR: S1, S2 normal. No murmurs, rubs, or gallops.  ABDOMEN: Soft, nontender, nondistended. Bowel sounds present. No organomegaly or mass.  EXTREMITIES: No pedal edema, cyanosis, or clubbing. Some echymosis present- might be due to old age. NEUROLOGIC: Cranial nerves II through XII are intact. Muscle strength 4/5 in all extremities. Sensation intact. Gait not checked.  PSYCHIATRIC: The patient is alert and oriented x 3. Appears anxious. SKIN: No obvious rash, lesion, or ulcer.   Physical Exam LABORATORY PANEL:   CBC  Recent Labs Lab 10/14/14 0454  WBC 9.4  HGB 10.5*  HCT 32.1*  PLT 170   ------------------------------------------------------------------------------------------------------------------  Chemistries   Recent Labs Lab October 27, 2014 2257 10/14/14 0454  NA 135 139  K 4.3 3.4*  CL 100* 103  CO2 29 28  GLUCOSE 160* 83  BUN 26* 21*  CREATININE 0.64 0.59  CALCIUM 8.4* 7.8*  AST 43*  --   ALT 36  --   ALKPHOS 167*  --   BILITOT 0.6  --    ------------------------------------------------------------------------------------------------------------------  Cardiac Enzymes  Recent Labs Lab 10/27/2014 2257  TROPONINI 0.03   ------------------------------------------------------------------------------------------------------------------  RADIOLOGY:  No results found.  ASSESSMENT AND PLAN:    1.Sepsis, meeting septic criteria by heart rate, leukocytosis, elevated respiratory rate present on arrival.   Source likely pulmonary. It may be episode of aspiration- as it happened right after eating.  Xray chest- clear, Blood cx negative, Urine- have few WBCs- vitals now stable.  Started on  Broad-spectrum antibiotics including vancomycin/Zosyn   Given IV fluid hydration to keep mean arterial pressure greater than 65.  changed Abx to just Levaquin to cover for UTI- pt was stable    Again on 6/18- pt had episode of aspiration and hypoxia- she is now on bipap.   As per swallow eval- she is at very high risk for recurrent aspirations, and should need PEG - if family wishes.   i had discussion wit pt's son Renae Fickle- about thi choice Vs comfort care- and he will discuss with his brothers and will let us know final decision.  2. Acute on chronic respi failure with hypoxia: Secondary to aspiration pneumonitis/pneumonia, continue  supplemental O2 to keep SaO2 greater than 92%,   Initially required BiPAP therapy , later on nasal canula oxygen , DuoNeb treatments every 4 hours  likely episode was aspiration  had another episode on 10/14/14- restarted on bipap.  3. Atrial fibrillation, rapid ventricular response: Goal heart rate less than 120, IV Cardizem if required   HR under control now, anticoagulated with coumadin.  4. Dysphagia: appreciated swallow eval- suggested PEG tube for feeding and no oral diet.  5. Venous thromboembolism prophylactic- on therapeutic warfarin   PT eval.  All the records are reviewed and case discussed with Care Management/Social Workerr. Management plans discussed with the patient, family and they are in agreement.  CODE STATUS: DNR  TOTAL TIME TAKING CARE OF THIS PATIENT: 45 minutes.   family will make decision about further care and let us know. Called palliative care consult.  More than 50% of the visit was spent in counseling/coordination of care  POSSIBLE D/C IN 1-2 DAYS, DEPENDING ON CLINICAL CONDITION.  Altamese Dilling M.D on 10/15/2014    Between 7am to 6pm - Pager - [phone]  After 6pm go to [email or web address] - password [email or web address] [facility]  [place] [facility]  Office  [phone]  CC: Primary care physician; No primary care provider on file.

## 2014-10-15 NOTE — Plan of Care (Signed)
Problem: Discharge Progression Outcomes Goal: Other Discharge Outcomes/Goals Outcome: Progressing 1. Palliative care consult ordered. Two sons here with Living Will and HPOA copies obtained and placed in chart; third son out of town on vacation.Family awaiting further information and Elenore Rota HPOA to come to decide on treatment plans. 2. Denies pain this shift. 3. Has weaned off bipap, 100 % NRBM to nasal cannula with pulse ox 9@ % on 6l/[place]. HR improved with irregular, irregular slightly tachy heart sounds this a.m. 4. No respiratory distress with resp. Status improved. Weaker cough producing copious amounts of very thick, tenacious, sticky brown sputum with streaks of blood. Required yankuer oral suction to remove mucous plug from upper air way with distress relief. Respiratory adjusting resp. Care. Frequent oral care provided with mouthcare kit, moisturizer and nystatin with swab. 5. NPO except crushed meds in small bites applesauce tolerated.  6. Pt at bed rest today, but up to [facility] with 2+; pt unable to void on bedpain despite all measures; urinary output down currently. Will closely monitor.

## 2014-10-15 NOTE — Progress Notes (Signed)
CNA called RN reporting pt spitting up thick sputum and having trouble removing it with bipap off. Pt sitting up in bed trying to cough unsuccessfully, holding hands on chest, color pale in distress. Pt alert/aware. Removed mucous plug from back of mouth with yankauer suction with breathing/color returning to normal. Pt resting quietly now. Son, Renae Fickle and his wife in and staffed with Dr. Elisabeth Pigeon. MD made aware.

## 2014-10-15 NOTE — Progress Notes (Signed)
ANTIBIOTIC CONSULT NOTE - FOLLOW UP  Pharmacy Consult for Levofloxacin Dosing Indication: CAP  Allergies  Allergen Reactions  . Amoxicillin Itching  . Doxycycline Other (See Comments)    Reaction: Unknown  . Polytrim [Polymyxin B-Trimethoprim] Other (See Comments)    Reaction:  Eye irritation   . Tolmetin Itching    Patient Measurements: Height:  (162.6 cm) Weight: 120 lb (54.432 kg) IBW/kg (Calculated) : 54.7   Vital Signs: Temp: 97.8 F (36.6 C) (06/19 0530) Temp Source: Oral (06/19 0530) BP: 153/75 mmHg (06/19 0530) Pulse Rate: 80 (06/19 0530) Intake/Output from previous day: 06/18 0701 - 06/19 0700 In: 3334 [P.O.:240; I.V.:3052; IV Piggyback:42] Out: 325 [Urine:325] Intake/Output from this shift:    Labs:  Recent Labs  20-Oct-2014 2257 Oct 20, 2014 2307 10/14/14 0454  WBC  --  18.9* 9.4  HGB  --  13.7 10.5*  PLT  --  268 170  CREATININE 0.64  --  0.59   Estimated Creatinine Clearance: 40.1 mL/min (by C-G formula based on Cr of 0.59). No results for input(s): VANCOTROUGH, VANCOPEAK, VANCORANDOM, GENTTROUGH, GENTPEAK, GENTRANDOM, TOBRATROUGH, TOBRAPEAK, TOBRARND, AMIKACINPEAK, AMIKACINTROU, AMIKACIN in the last 72 hours.   Microbiology: Recent Results (from the past 720 hour(s))  Blood culture (routine x 2)     Status: None   Collection Time: 09/20/14  4:20 PM  Result Value Ref Range Status   Specimen Description BLOOD  Final   Special Requests NONE  Final   Culture NO GROWTH 5 DAYS  Final   Report Status 09/25/2014 FINAL  Final  Blood culture (routine x 2)     Status: None   Collection Time: 09/20/14  8:00 PM  Result Value Ref Range Status   Specimen Description BLOOD  Final   Special Requests NONE  Final   Culture NO GROWTH 5 DAYS  Final   Report Status 09/25/2014 FINAL  Final  MRSA PCR Screening     Status: None   Collection Time: 09/21/14  3:45 AM  Result Value Ref Range Status   MRSA by PCR NEGATIVE NEGATIVE Final    Comment:        The  GeneXpert MRSA Assay (FDA approved for NASAL specimens only), is one component of a comprehensive MRSA colonization surveillance program. It is not intended to diagnose MRSA infection nor to guide or monitor treatment for MRSA infections.   Urine culture     Status: None   Collection Time: 09/21/14 11:02 AM  Result Value Ref Range Status   Specimen Description URINE, RANDOM  Final   Special Requests NONE  Final   Culture NO GROWTH 2 DAYS  Final   Report Status 09/23/2014 FINAL  Final  C difficile quick scan w PCR reflex [facility])     Status: None   Collection Time: 09/23/14  7:33 AM  Result Value Ref Range Status   C Diff antigen NEGATIVE  Final   C Diff toxin NEGATIVE  Final   C Diff interpretation Negative for C. difficile  Final  Blood culture (routine x 2)     Status: None (Preliminary result)   Collection Time: 10/20/2014 10:49 PM  Result Value Ref Range Status   Specimen Description BLOOD  Final   Special Requests Normal  Final   Culture NO GROWTH 3 DAYS  Final   Report Status PENDING  Incomplete  Blood culture (routine x 2)     Status: None (Preliminary result)   Collection Time: 10/20/14 10:49 PM  Result Value Ref Range Status  Specimen Description BLOOD  Final   Special Requests Normal  Final   Culture NO GROWTH 3 DAYS  Final   Report Status PENDING  Incomplete  MRSA PCR Screening     Status: None   Collection Time: 29-Oct-2014 10:49 PM  Result Value Ref Range Status   MRSA by PCR NEGATIVE NEGATIVE Final    Comment:        The GeneXpert MRSA Assay (FDA approved for NASAL specimens only), is one component of a comprehensive MRSA colonization surveillance program. It is not intended to diagnose MRSA infection nor to guide or monitor treatment for MRSA infections. NEGATIVE     Anti-infectives    Start     Dose/Rate Route Frequency Ordered Stop   10/17/14 1000  levofloxacin (LEVAQUIN) IVPB 750 mg     750 mg 100 mL/hr over 90 Minutes Intravenous Every 48 hours  10/15/14 1104     10/15/14 1115  levofloxacin (LEVAQUIN) IVPB 500 mg     500 mg 100 mL/hr over 60 Minutes Intravenous  Once 10/15/14 1104     10/14/14 1630  levofloxacin (LEVAQUIN) tablet 250 mg  Status:  Discontinued     250 mg Oral Daily 10/14/14 0807 10/15/14 1048   10/13/14 1630  Levofloxacin (LEVAQUIN) IVPB 250 mg  Status:  Discontinued     250 mg 50 mL/hr over 60 Minutes Intravenous Every 24 hours 10/13/14 1628 10/14/14 0807   10/13/14 1600  vancomycin (VANCOCIN) IVPB 1000 mg/200 mL premix  Status:  Discontinued     1,000 mg 200 mL/hr over 60 Minutes Intravenous Every 24 hours 10/13/14 0359 10/13/14 1624   10/13/14 1400  piperacillin-tazobactam (ZOSYN) IVPB 3.375 g  Status:  Discontinued     3.375 g 12.5 mL/hr over 240 Minutes Intravenous 3 times per day 10/13/14 0949 10/13/14 1624   10/13/14 0600  piperacillin-tazobactam (ZOSYN) IVPB 4.5 g  Status:  Discontinued     4.5 g 25 mL/hr over 240 Minutes Intravenous 3 times per day 10/13/14 0359 10/13/14 0949   10-29-14 2330  vancomycin (VANCOCIN) IVPB 1000 mg/200 mL premix     1,000 mg 200 mL/hr over 60 Minutes Intravenous  Once 2014/10/29 2326 10/13/14 0056   10/29/2014 2330  piperacillin-tazobactam (ZOSYN) IVPB 3.375 g  Status:  Discontinued     3.375 g 12.5 mL/hr over 240 Minutes Intravenous  Once October 29, 2014 2326 2014/10/29 2327   October 29, 2014 2330  cefTRIAXone (ROCEPHIN) 2 g in dextrose 5 % 50 mL IVPB - Premix     2 g 100 mL/hr over 30 Minutes Intravenous  Once October 29, 2014 2327 10/13/14 0246      Assessment: 81 yo female currently ordered levofloxacin 250mg  Daily for UTI; MD wishes to expand dosing to include CAP.     Plan:  Will order additional levofloxacin 500mg  IV x 1. Will resume levofloxacin 750mg  IV Q48hr with next scheduled dose on 6/21.    Pharmacy will continue to monitor and adjust per consult.    Simpson,Michael L 10/15/2014,11:05 AM

## 2014-10-16 ENCOUNTER — Inpatient Hospital Stay: Payer: Medicare Other

## 2014-10-16 DIAGNOSIS — R5383 Other fatigue: Secondary | ICD-10-CM

## 2014-10-16 DIAGNOSIS — F329 Major depressive disorder, single episode, unspecified: Secondary | ICD-10-CM

## 2014-10-16 DIAGNOSIS — I4891 Unspecified atrial fibrillation: Secondary | ICD-10-CM

## 2014-10-16 DIAGNOSIS — F0390 Unspecified dementia without behavioral disturbance: Secondary | ICD-10-CM

## 2014-10-16 DIAGNOSIS — J189 Pneumonia, unspecified organism: Secondary | ICD-10-CM

## 2014-10-16 DIAGNOSIS — D649 Anemia, unspecified: Secondary | ICD-10-CM

## 2014-10-16 DIAGNOSIS — Z8719 Personal history of other diseases of the digestive system: Secondary | ICD-10-CM

## 2014-10-16 DIAGNOSIS — R1310 Dysphagia, unspecified: Secondary | ICD-10-CM

## 2014-10-16 DIAGNOSIS — Z515 Encounter for palliative care: Secondary | ICD-10-CM

## 2014-10-16 LAB — BASIC METABOLIC PANEL
Anion gap: 4 — ABNORMAL LOW (ref 5–15)
BUN: 25 mg/dL — AB (ref 6–20)
CO2: 25 mmol/L (ref 22–32)
Calcium: 8.3 mg/dL — ABNORMAL LOW (ref 8.9–10.3)
Chloride: 113 mmol/L — ABNORMAL HIGH (ref 101–111)
Creatinine, Ser: 0.62 mg/dL (ref 0.44–1.00)
Glucose, Bld: 73 mg/dL (ref 65–99)
Potassium: 4.8 mmol/L (ref 3.5–5.1)
Sodium: 142 mmol/L (ref 135–145)

## 2014-10-16 LAB — CBC
HEMATOCRIT: 35.1 % (ref 35.0–47.0)
HEMOGLOBIN: 11.4 g/dL — AB (ref 12.0–16.0)
MCH: 31.9 pg (ref 26.0–34.0)
MCHC: 32.3 g/dL (ref 32.0–36.0)
MCV: 98.5 fL (ref 80.0–100.0)
Platelets: 187 10*3/uL (ref 150–440)
RBC: 3.56 MIL/uL — ABNORMAL LOW (ref 3.80–5.20)
RDW: 14.9 % — AB (ref 11.5–14.5)
WBC: 15.3 10*3/uL — ABNORMAL HIGH (ref 3.6–11.0)

## 2014-10-16 LAB — PROTIME-INR
INR: 3.37
Prothrombin Time: 34.1 seconds — ABNORMAL HIGH (ref 11.4–15.0)

## 2014-10-16 MED ORDER — WARFARIN SODIUM 1 MG PO TABS: 1.0000 mg | ORAL_TABLET | Freq: Every day | ORAL | Status: DC

## 2014-10-16 MED ORDER — CLINDAMYCIN PHOSPHATE 300 MG/50ML IV SOLN
300.0000 mg | Freq: Three times a day (TID) | INTRAVENOUS | Status: DC
  Administered 2014-10-16: 14:00:00 300 mg via INTRAVENOUS
  Filled 2014-10-16 (×4): qty 50

## 2014-10-16 MED ORDER — POTASSIUM CHLORIDE IN NACL 40-0.9 MEQ/L-% IV SOLN
INTRAVENOUS | Status: DC
  Administered 2014-10-16: 50 mL/h via INTRAVENOUS
  Filled 2014-10-16 (×2): qty 1000

## 2014-10-16 MED ORDER — SODIUM CHLORIDE 0.9 % IV SOLN
INTRAVENOUS | Status: DC
  Administered 2014-10-16: 14:00:00 via INTRAVENOUS

## 2014-10-17 LAB — CULTURE, BLOOD (ROUTINE X 2)
Culture: NO GROWTH
Culture: NO GROWTH
Special Requests: NORMAL
Special Requests: NORMAL

## 2014-10-27 NOTE — Consult Note (Signed)
Palliative Medicine Inpatient Consult Note   Name: Jamie Norton Date: 10-25-14 MRN: [id number]  DOB: 09-21-1921  Referring Physician: Hillary Bow, MD  Palliative Care consult requested for this 81 y.o. female for goals of medical therapy in patient with dysphasia admitted with aspiration pneumonia  Jamie Norton is a 81 yo woman with PMH of dysphagia with h/o aspiration pneumonia (hospitalization 5/25-09/27/14), a.fib, anemia, gastritis, mild dementia, depression. She was admitted 10/05/2014 with choking episode at SNF and is being treated for presumed aspiration pneumonia.  At present, pt is sitting up in bed. Awake, answers questions appropriately. Son at bedside.  REVIEW OF SYSTEMS:  Pain: None Dyspnea:  Yes Nausea/Vomiting:  No Diarrhea:  No Constipation:   No Depression:   No Anxiety:   No Fatigue:   Yes  SOCIAL HISTORY: Pt is currently in skilled part of [facility]. She has been there since d/c from [facility] 09/27/14. Her goal is to return to ALF at [facility]. Pt is widowed. Son Timmothy Sours is pt's HCPOA.   reports that she has never smoked. She does not have any smokeless tobacco history on file. She reports that she does not drink alcohol or use illicit drugs.  CODE STATUS: DNR  PAST MEDICAL HISTORY: Past Medical History  Diagnosis Date  . Atrial fibrillation   . Dementia   . Gastritis   . Anemia   . Depression   . Aspiration into airway     PAST SURGICAL HISTORY: History reviewed. No pertinent past surgical history.  ALLERGIES:  is allergic to amoxicillin; doxycycline; polytrim; and tolmetin.  MEDICATIONS:  Current Facility-Administered Medications  Medication Dose Route Frequency Provider Last Rate Last Dose  . 0.9 % NaCl with KCl 40 mEq / L  infusion   Intravenous Continuous Vaughan Basta, MD 75 mL/hr at 10-25-14 0322 75 mL/hr at 10-25-14 0322  . acetaminophen (TYLENOL) tablet 650 mg  650 mg Oral Q6H PRN Lytle Butte, MD       Or  . acetaminophen (TYLENOL)  suppository 650 mg  650 mg Rectal Q6H PRN Lytle Butte, MD      . acetylcysteine (MUCOMYST) 20 % nebulizer / oral solution 1 mL  1 mL Nebulization BID Vaughan Basta, MD   1 mL at 10-25-14 0757  . carvedilol (COREG) tablet 3.125 mg  3.125 mg Oral BID WC Lytle Butte, MD   3.125 mg at October 25, 2014 0856  . cholecalciferol (VITAMIN D) tablet 1,000 Units  1,000 Units Oral Daily Lytle Butte, MD   1,000 Units at October 25, 2014 [phone]  . ipratropium-albuterol (DUONEB) 0.5-2.5 (3) MG/3ML nebulizer solution 3 mL  3 mL Nebulization Q4H PRN Lytle Butte, MD   3 mL at 10/14/14 1345  . ipratropium-albuterol (DUONEB) 0.5-2.5 (3) MG/3ML nebulizer solution 3 mL  3 mL Nebulization BID Vaughan Basta, MD   3 mL at 2014-10-25 0756  . [START ON 10/17/2014] levofloxacin (LEVAQUIN) IVPB 750 mg  750 mg Intravenous Q48H Charlett Nose, RPH      . morphine 2 MG/ML injection 1 mg  1 mg Intravenous Q6H PRN Vaughan Basta, MD   1 mg at 10/14/14 1834  . nystatin (MYCOSTATIN) 100000 UNIT/ML suspension 500,000 Units  5 mL Oral QID Lytle Butte, MD   500,000 Units at 25-Oct-2014 0855  . ondansetron (ZOFRAN) tablet 4 mg  4 mg Oral Q6H PRN Lytle Butte, MD       Or  . ondansetron [facility]) injection 4 mg  4 mg Intravenous Q6H  PRN Lytle Butte, MD      . polyvinyl alcohol (LIQUIFILM TEARS) 1.4 % ophthalmic solution 1 drop  1 drop Both Eyes BID BM & HS Vaughan Basta, MD   1 drop at October 26, 2014 0909  . sertraline (ZOLOFT) tablet 50 mg  50 mg Oral QHS Lytle Butte, MD   50 mg at 10/15/14 2047  . sodium chloride 0.9 % injection 3 mL  3 mL Intravenous Q12H Lytle Butte, MD   3 mL at 10/26/2014 0908  . sodium chloride 0.9 % injection 3 mL  3 mL Intravenous PRN Vaughan Basta, MD   3 mL at 10/14/14 1834    Vital Signs: BP 155/76 mmHg  Pulse 110  Temp(Src) 98.2 F (36.8 C) (Oral)  Resp 20  Ht 5' 4"  (1.626 m)  Wt 54.432 kg (120 lb)  BMI 20.59 kg/m2  SpO2 94% Filed Weights   10/20/2014 2304  Weight: 54.432  kg (120 lb)    Estimated body mass index is 20.59 kg/(m^2) as calculated from the following:   Height as of this encounter: 5' 4"  (1.626 m).   Weight as of this encounter: 54.432 kg (120 lb).   PHYSICAL EXAM:  General: chronically ill appearing HEENT: OP clear, hearing intact Neck: Trachea midline  Cardiovascular: regular rhythm, tachycardic Pulmonary/Chest: ronchi ant fields Abdominal: Soft, nontender, hypoactive bowel sounds GU: No SP tenderness Extremities: trace edema ankles Neurological: Grossly nonfocal Skin: venous statsis changes LE's Psychiatric: alert and oriented  LABS: CBC:  Recent Labs Lab 10/14/14 0454 10-26-14 0427  WBC 9.4 15.3*  HGB 10.5* 11.4*  HCT 32.1* 35.1  PLT 170 187   Comprehensive Metabolic Panel:  Recent Labs Lab 10/07/2014 2257 10/14/14 0454 10-26-14 0427  NA 135 139 142  K 4.3 3.4* 4.8  CL 100* 103 113*  CO2 29 28 25   GLUCOSE 160* 83 73  BUN 26* 21* 25*  CREATININE 0.64 0.59 0.62  CALCIUM 8.4* 7.8* 8.3*  AST 43*  --   --   ALT 36  --   --   ALKPHOS 167*  --   --   BILITOT 0.6  --   --     IMPRESSION: Jamie Norton is a 81 yo woman with PMH of dysphagia with h/o aspiration pneumonia (hospitalization 5/25-09/27/14), a.fib, anemia, gastritis, mild dementia, depression. She was admitted 09/29/2014 with choking episode at SNF and is being treated for presumed aspiration pneumonia.    Pt known to me from previous hospitalization. At that time, I met with pt and her son, Timmothy Sours, who is pt's HCPOA. We discussed PEG with or without continued po and pt chose not to have PEG and to eat the diet recommended by ST. She was reportedly doing well at SNF with goal of returning to ALF.   Pt's son Timmothy Sours is out of town and will be here later today or tomorrow. I will meet with him at that time to again discuss goals of therapy.   It is unclear to me if pt has had work-up for cause of her dysphagia. Discussed with Dr Tamsen Meek. Will get BaS if pt has not had  work-up.   PLAN: As above   More than 50% of the visit was spent in counseling/coordination of care: YES  Time spent: 70 minutes

## 2014-10-27 NOTE — Progress Notes (Signed)
PT Cancellation Note  Patient Details Name: Jamie Norton MRN: [id number] DOB: 1922/03/19   Cancelled Treatment:    Reason Eval/Treat Not Completed: Patient at procedure or test/unavailable   Kristeen Miss 10/06/2014, 11:35 AM

## 2014-10-27 NOTE — Progress Notes (Signed)
ANTICOAGULATION CONSULT NOTE - Follow Up Consult  Pharmacy Consult for warfarin Indication: atrial fibrillation  Allergies  Allergen Reactions  . Amoxicillin Itching  . Doxycycline Other (See Comments)    Reaction: Unknown  . Polytrim [Polymyxin B-Trimethoprim] Other (See Comments)    Reaction:  Eye irritation   . Tolmetin Itching    Patient Measurements: Height: 5\' 4"  (162.6 cm) Weight: 120 lb (54.432 kg) IBW/kg (Calculated) : 54.7   Vital Signs: Temp: 98.2 F (36.8 C) (06/20 0541) Temp Source: Oral (06/20 0541) BP: 155/76 mmHg (06/20 0856) Pulse Rate: 110 (06/20 0856)  Labs:  Recent Labs  10/14/14 0454 10/15/14 0428 11-02-2014 0427  HGB 10.5*  --  11.4*  HCT 32.1*  --  35.1  PLT 170  --  187  LABPROT 30.5* 29.7* 34.1*  INR 2.91 2.81 3.37  CREATININE 0.59  --  0.62    Estimated Creatinine Clearance: 40.1 mL/min (by C-G formula based on Cr of 0.62).   Medications:  Scheduled:  . acetylcysteine  1 mL Nebulization BID  . carvedilol  3.125 mg Oral BID WC  . cholecalciferol  1,000 Units Oral Daily  . ipratropium-albuterol  3 mL Nebulization BID  . [START ON 10/17/2014] levofloxacin (LEVAQUIN) IV  750 mg Intravenous Q48H  . nystatin  5 mL Oral QID  . polyvinyl alcohol  1 drop Both Eyes BID BM & HS  . sertraline  50 mg Oral QHS  . sodium chloride  3 mL Intravenous Q12H  . [START ON 10/17/2014] warfarin  1 mg Oral q1800    Assessment: Patient is a 81 yo female admitted for possible sepsis.  Patient receiving Levaquin 500 mg po daily for treatment of CAP and UTI.  Pharmacy consulted to dose warfarin.  Patient with atrial fibrillation and takes warfarin chronically.  Outpatient dosing of warfarin 2 mg po daily.   INR History: 6/17: INR - 2.27, received warfarin 2 mg  6/18: INR - 2.91, received warfarin 1 mg  6/19: INR - 2.81, received warfarin 1 mg 6/20: INR - 3.37  Goal of Therapy:  INR 2-3 Monitor hemoglobin per policy   Plan:  Will hold dose of  warfarin for today and recheck INR in AM.  INR may be increasing due to interaction with Levaquin.    Pharmacy will continue to follow.  Clarisa Schools, PharmD Clinical Pharmacist 11-02-2014

## 2014-10-27 NOTE — Consult Note (Signed)
Consulted regarding inability to swallow.  Patient was found apneic and pulseless. She is listed as DNR. Nursing staff in the room immediately.

## 2014-10-27 NOTE — Discharge Summary (Signed)
[facility] - [place] at [facility]    Death Note  In breif -  Admitting history Jamie Norton is a 81 y.o. female with a known history of A. fib, dementia and anemia. The patient was sent from nursing home to the ED due to 2 episodes of aspiration during lunch today. The patient O2 saturation was 70s. The patient is alert awake oriented 3. She complains of a cough and shortness of breath, but denies any other symptoms. The chest x-ray show left sided aspiration pneumonia.  Hospital course  * Bilateral aspiration pneumonia with Acute on chronic respiratory failure with severe sepsis - persent on admission Patient was on IV Levaquin and clindamycin for aspiration pneumonia. Nothing by mouth to prevent any further aspiration. Was initially placed on BiPAP and later transitioned to nasal cannula.  * Dysphagia Pt has baseline dysphagia. This is significantly worse. No change in speech. Patient unable to swallow barium for swallow study. Was unable to swallow barium for barium swallow study.. GI was concentrated for possible endoscopy.  * Atrial fibrillation, rapid ventricular response- Improved  * Venous thromboembolism prophylactic- on therapeutic warfarin  Patient deteriorated on the day of death. He was DNR/DNI.    Udell Haspel CSN:[id number],MRN:[id number] is a 81 y.o. female, Outpatient Primary MD for the patient is No primary care provider on file.  Pronounced dead  on  2014-11-04 @ 1600                Cause of death   * Aspiration pneumonia * Severe sepsis  Other diagnosis  * Acute respiratory failure, hypoxic * Dysphagia * Real fibrillation with rapid ventricular response    Milagros Loll R M.D on 10/17/2014 at 1:16 PM  [facility] - [place] at [facility]    OFFICE [phone]

## 2014-10-27 NOTE — Plan of Care (Signed)
Problem: Discharge Progression Outcomes Goal: Other Discharge Outcomes/Goals Outcome: Progressing Patient is alert, remains confused. NPO, takes pills crushed in small bites of applesauce. Oral care and suctioning provided. 1 assist to the [facility]. Continues on nasal cannula, normal oxygen saturations. No pain at this time.

## 2014-10-27 NOTE — Progress Notes (Addendum)
Patient pronounced dead at 1600. Verified with Canary Brim RN. Dr Phifer and Dr Elpidio Anis notified. Nursing supervisor notified. Son Anjanette Rippey notified of passing. Funeral home information obtained. from son. Post mortem care performed.

## 2014-10-27 NOTE — Consult Note (Signed)
Follow up visit made. Pt has declined since this AM. Now on HFNC at 90% with 02 sats in high 80's. Not as responsive as earlier. I called and spoke with 2 of pt's 3 sons including Roe Coombs who is HCPOA. All sons are out of town but Roe Coombs will be back tonight and will come to hospital. He understands that pt is critically ill and may not survive this illness. Son expressed appreciation for call.

## 2014-10-27 NOTE — Progress Notes (Signed)
[facility] - [place] at [facility]   PATIENT NAME: Jamie Norton    MR#:  [id number]  DATE OF BIRTH:  01-Dec-1921  SUBJECTIVE:  CHIEF COMPLAINT:   Chief Complaint  Patient presents with  . Shortness of Breath   Unable to swallow and is NPO. On 6 L O2. Awake and talking. SOB and cough present. Afebrile  REVIEW OF SYSTEMS:  CONSTITUTIONAL: No fever, fatigue or weakness.  EYES: No blurred or double vision.  EARS, NOSE, AND THROAT: No tinnitus or ear pain.  RESPIRATORY: No cough. Has severe shortness of breath. no wheezing or hemoptysis.  CARDIOVASCULAR: No chest pain, orthopnea, edema.  GASTROINTESTINAL: No nausea, vomiting, diarrhea or abdominal pain. Dysphagia GENITOURINARY: No dysuria, hematuria.  ENDOCRINE: No polyuria, nocturia,  HEMATOLOGY: No anemia, easy bruising or bleeding SKIN: No rash or lesion. MUSCULOSKELETAL: No joint pain or arthritis.   NEUROLOGIC: No tingling, numbness, weakness.  PSYCHIATRY: No anxiety or depression.   ROS  DRUG ALLERGIES:   Allergies  Allergen Reactions  . Amoxicillin Itching  . Doxycycline Other (See Comments)    Reaction: Unknown  . Polytrim [Polymyxin B-Trimethoprim] Other (See Comments)    Reaction:  Eye irritation   . Tolmetin Itching    VITALS:  Blood pressure 142/72, pulse 98, temperature 97.9 F (36.6 C), temperature source Oral, resp. rate 22, height  (1.626 m), weight 54.432 kg (120 lb), SpO2 90 %.  PHYSICAL EXAMINATION:  GENERAL:  81 y.o.-year-old patient lying in the bed with  Acute respiratory  distress. Looks critically ill EYES: Pupils equal, round, reactive to light and accommodation. No scleral icterus. Extraocular muscles intact.  HEENT: Head atraumatic, normocephalic. Oropharynx and nasopharynx clear.  NECK:  Supple, no jugular venous distention. No thyroid enlargement, no tenderness.  LUNGS: Increased work of breathing. Coarse breath sounds bilateral. CARDIOVASCULAR: S1, S2  normal. No murmurs, rubs, or gallops.  ABDOMEN: Soft, nontender, nondistended. Bowel sounds present. No organomegaly or mass.  EXTREMITIES: No pedal edema, cyanosis, or clubbing. Some echymosis present- might be due to old age. NEUROLOGIC: Cranial nerves II through XII are intact. Muscle strength 4/5 in all extremities. Sensation intact. Gait not checked.  PSYCHIATRIC: The patient is alert and oriented x 3. Appears anxious. SKIN: No obvious rash, lesion, or ulcer.   Physical Exam LABORATORY PANEL:   CBC  Recent Labs Lab 10/25/2014 0427  WBC 15.3*  HGB 11.4*  HCT 35.1  PLT 187   ------------------------------------------------------------------------------------------------------------------  Chemistries   Recent Labs Lab 2014/11/03 2257  09/30/2014 0427  NA 135  < > 142  K 4.3  < > 4.8  CL 100*  < > 113*  CO2 29  < > 25  GLUCOSE 160*  < > 73  BUN 26*  < > 25*  CREATININE 0.64  < > 0.62  CALCIUM 8.4*  < > 8.3*  AST 43*  --   --   ALT 36  --   --   ALKPHOS 167*  --   --   BILITOT 0.6  --   --   < > = values in this interval not displayed. ------------------------------------------------------------------------------------------------------------------  Cardiac Enzymes  Recent Labs Lab 03-Nov-2014 2257  TROPONINI 0.03   ------------------------------------------------------------------------------------------------------------------  RADIOLOGY:  Dg Esophagus  10/03/2014   CLINICAL DATA:  Dysphagia.  EXAM: ESOPHOGRAM/BARIUM SWALLOW  TECHNIQUE: Single contrast examination was performed using  thin barium.  FLUOROSCOPY TIME:  Fluoroscopy Time:  1 minutes 36 seconds  Number of Acquired Images:  1  COMPARISON:  None .  FINDINGS: Multiple attempts were made it having the patient drank barium including significant dilution of the of barium. The patient could not drank the barium. Exam was terminated.  IMPRESSION: Patient could not drink barium. Exam terminated. Repeat attempt can  be performed as needed.   Electronically Signed   By: Maisie Fus  Register   On: 09/29/2014 12:30    ASSESSMENT AND PLAN:    * Bilateral aspiration pneumonia with Acute on chronic respiratory failure with severe sepsis - percent on admission -Add clindamycin to Levaquin through IV. Cultures pending - Oxygen to keep saturations over 90%. -Scheduled nebulizer therapy. - Bipap PRN  * Dysphagia Pt has baseline dysphagia. This is significantly worse. No change in speech. Patient unable to swallow barium for swallow study. Will request GI to see patient. May need EGD.  * Atrial fibrillation, rapid ventricular response- Improved  * Venous thromboembolism prophylactic- on therapeutic warfarin   PT eval when improved  All the records are reviewed and case discussed with Care Management/Social Workerr. Management plans discussed with the patient, family and they are in agreement.  CODE STATUS: DNR  TOTAL CC TIME TAKING CARE OF THIS PATIENT: 35 minutes.    Milagros Loll R M.D on 10/09/2014    Between 7am to 6pm - Pager - [phone]  After 6pm go to [email or web address] - password [email or web address] [facility]  [facility]  Office  [phone]  CC: Primary care physician; No primary care provider on file.

## 2014-10-27 NOTE — Progress Notes (Signed)
PT Cancellation Note  Patient Details Name: Jamie Norton MRN: [id number] DOB: [birth year]   Cancelled Treatment:    Reason Eval/Treat Not Completed: Patient declined, no reason specified. Pt does not speak; pt just shakes head no when offered therapy.    Elsie Stain Bishop 10/24/2014, 1:57 PM

## 2014-10-27 NOTE — Progress Notes (Signed)
Initial Nutrition Assessment  INTERVENTION:  Coordination of Care: will follow poc as palliative care note, states pt did not want PEG on last admission and remained on diet order per SLP, also note GI evaluation pending. Will make recommendations accordingly to aid pt in meeting nutritional needs.  Will also request another measured weight.    NUTRITION DIAGNOSIS:  Inadequate oral intake related to dysphagia as evidenced by NPO status.  GOAL:   (Diet advancement as medically able, will follow poc)  MONITOR:   (Energy Intake, Digestive system, electrolyte and renal Profile, Anthropometrics)  REASON FOR ASSESSMENT:   (RD Screen)    ASSESSMENT:  Pt admitted with sepsis and acute respiratory failure secondary to aspiration pna. Pt with known h/o dysphagia. PMHx:  Past Medical History  Diagnosis Date  . Atrial fibrillation   . Dementia   . Gastritis   . Anemia   . Depression   . Aspiration into airway     Current Nutrition: Pt currently NPO. Pt had aspiration event while eating lunch likely on this past Saturday.   Food/Nutrition-Related History: Pt son reports pt usually eats 'because its time.' Pt on dysphagia III diet order PTA and dysphagia I during admission, SLP following. Pt reports liking strawberry ensure.   Medications: vitamin D, NS at 11mL/hr  Electrolyte/Renal Profile and Glucose Profile:   Recent Labs Lab 10/13/2014 2257 10/14/14 0454 11/08/14 0427  NA 135 139 142  K 4.3 3.4* 4.8  CL 100* 103 113*  CO2 29 28 25   BUN 26* 21* 25*  CREATININE 0.64 0.59 0.62  CALCIUM 8.4* 7.8* 8.3*  GLUCOSE 160* 83 73   Protein Profile:  Recent Labs Lab 10/07/2014 2257  ALBUMIN 2.9*    Gastrointestinal Profile: Patient Vitals for the past 24 hrs:  Urine Occurrence  08-Nov-2014 0029 1  10/15/14 1936 1  10/15/14 1635 1  Last BM:6/19   Nutrition-Focused Physical Exam Findings: Nutrition-Focused physical exam completed. Findings are WDL fat depletion, severe  muscle depletion of clavicle and mil/moderate shoulder and scapula region, and moderate edema of bilateral LE.    Weight Change: Pt unable to clarify weight. RD measured weight of pt in room of 106.7lbs however extra pillows on bed. Weight last admission 101lbs.  Height:  Ht Readings from Last 1 Encounters:  10/02/2014 5\' 4"  (1.626 m)    Weight:  Wt Readings from Last 1 Encounters:  09/29/2014 120 lb (54.432 kg)    Wt Readings from Last 10 Encounters:  09/29/2014 120 lb (54.432 kg)  09/20/14 101 lb (45.813 kg)    BMI:  Body mass index is 20.59 kg/(m^2).  Estimated Nutritional Needs:  Kcal:  1254-1482kcals, BEE: 950kcals, TEE: (IF 1.1-1.3)(AF 1.2)   Protein:  55-65g protein (1.0-1.2g/kg)  Fluid:  1363-1615mL of fluid (25-55mL/kg)  Skin:   reviewed, no issues  Diet Order:  Diet NPO time specified Except for: Other (See Comments)  EDUCATION NEEDS:  No education needs identified at this time   Intake/Output Summary (Last 24 hours) at Nov 08, 2014 1353 Last data filed at 11/08/14 0900  Gross per 24 hour  Intake   1242 ml  Output    225 ml  Net   1017 ml     HIGH Care Level  Leda Quail, RD, LDN Pager [phone]

## 2014-10-27 DEATH — deceased

## 2016-06-03 IMAGING — DX DG CHEST 1V
1 series · 1 of 1 positions shown · non-contrast
Comparison: CT scan of the chest October 11, 2009

CLINICAL DATA: Multiple aspiration episodes, hypoxia.

EXAM:
CHEST  1 VIEW

[chest ap]
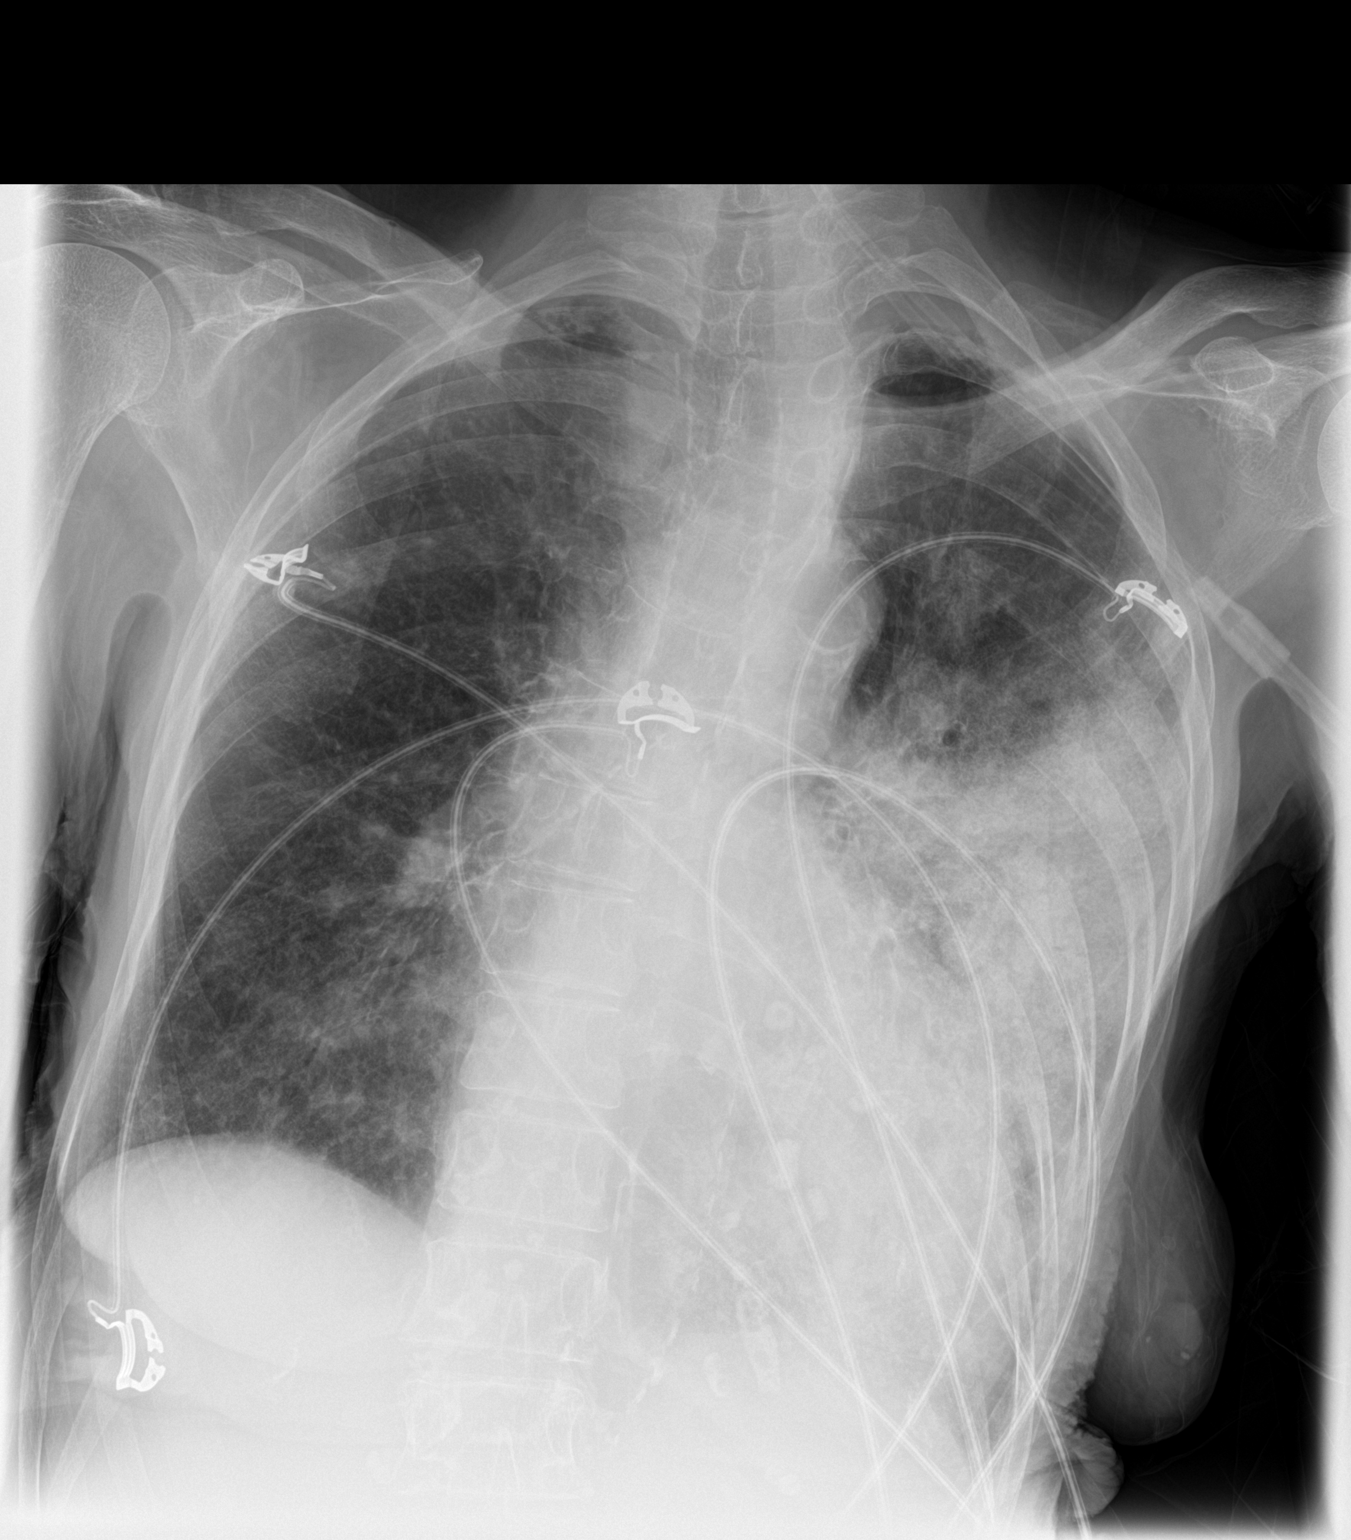

[1 of 1 positions shown; findings below may reference images not displayed]

FINDINGS: There is dense infiltrate involving the left lower lobe. The left
heart border remains faintly visible. The right lung is adequately
inflated without focal infiltrate. There is no pulmonary vascular
congestion. There is no definite pleural effusion. There is acute
angulation of the upper thoracic spine in a reverse S-shaped
configuration which is stable.
IMPRESSION: Findings consistent with aspiration pneumonia in the left lower
lobe. There is underlying COPD. A PA and lateral chest x-ray would
be useful when the patient can tolerate the procedure.

## 2016-06-06 IMAGING — CR DG CHEST 2V
1 series · 2 of 2 positions shown · non-contrast
Comparison: 09/22/2014

CLINICAL DATA: Shortness of breath, pneumonia

EXAM:
CHEST  2 VIEW

[Series 1: dg chest 2 view · 0.14mm/px · 2 of 2 slices shown]
[im 1/2]
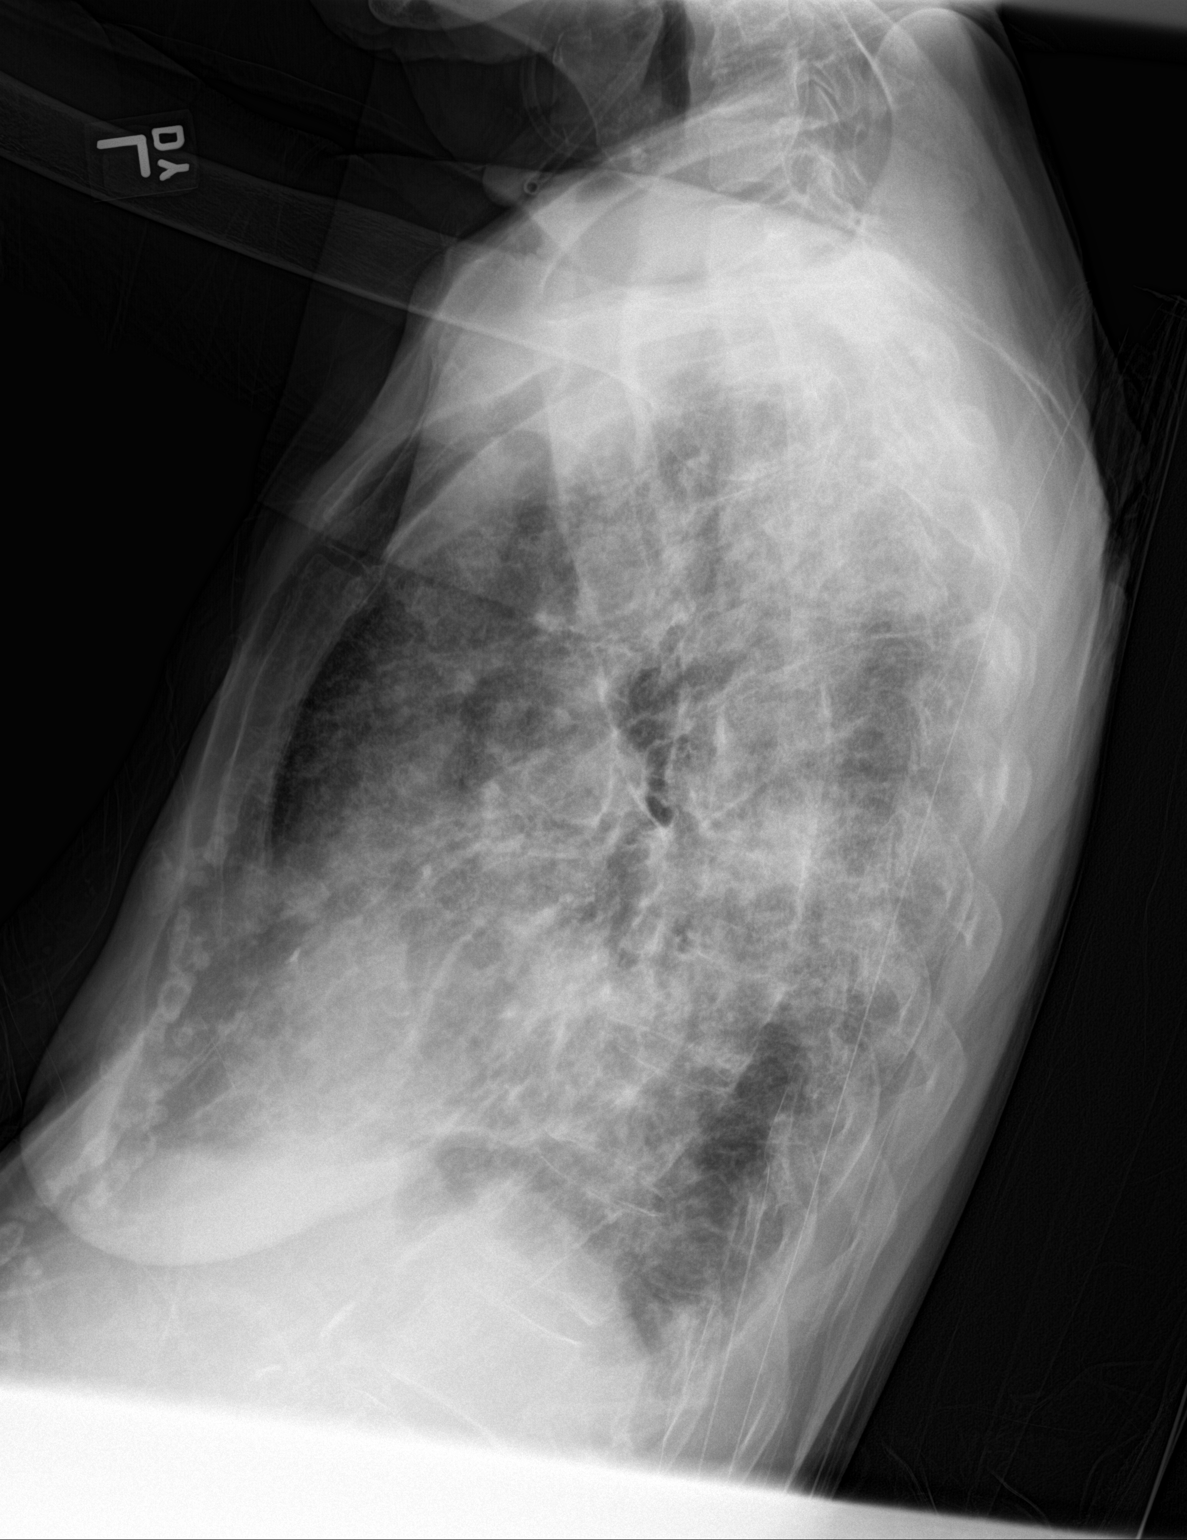
[im 2/2]
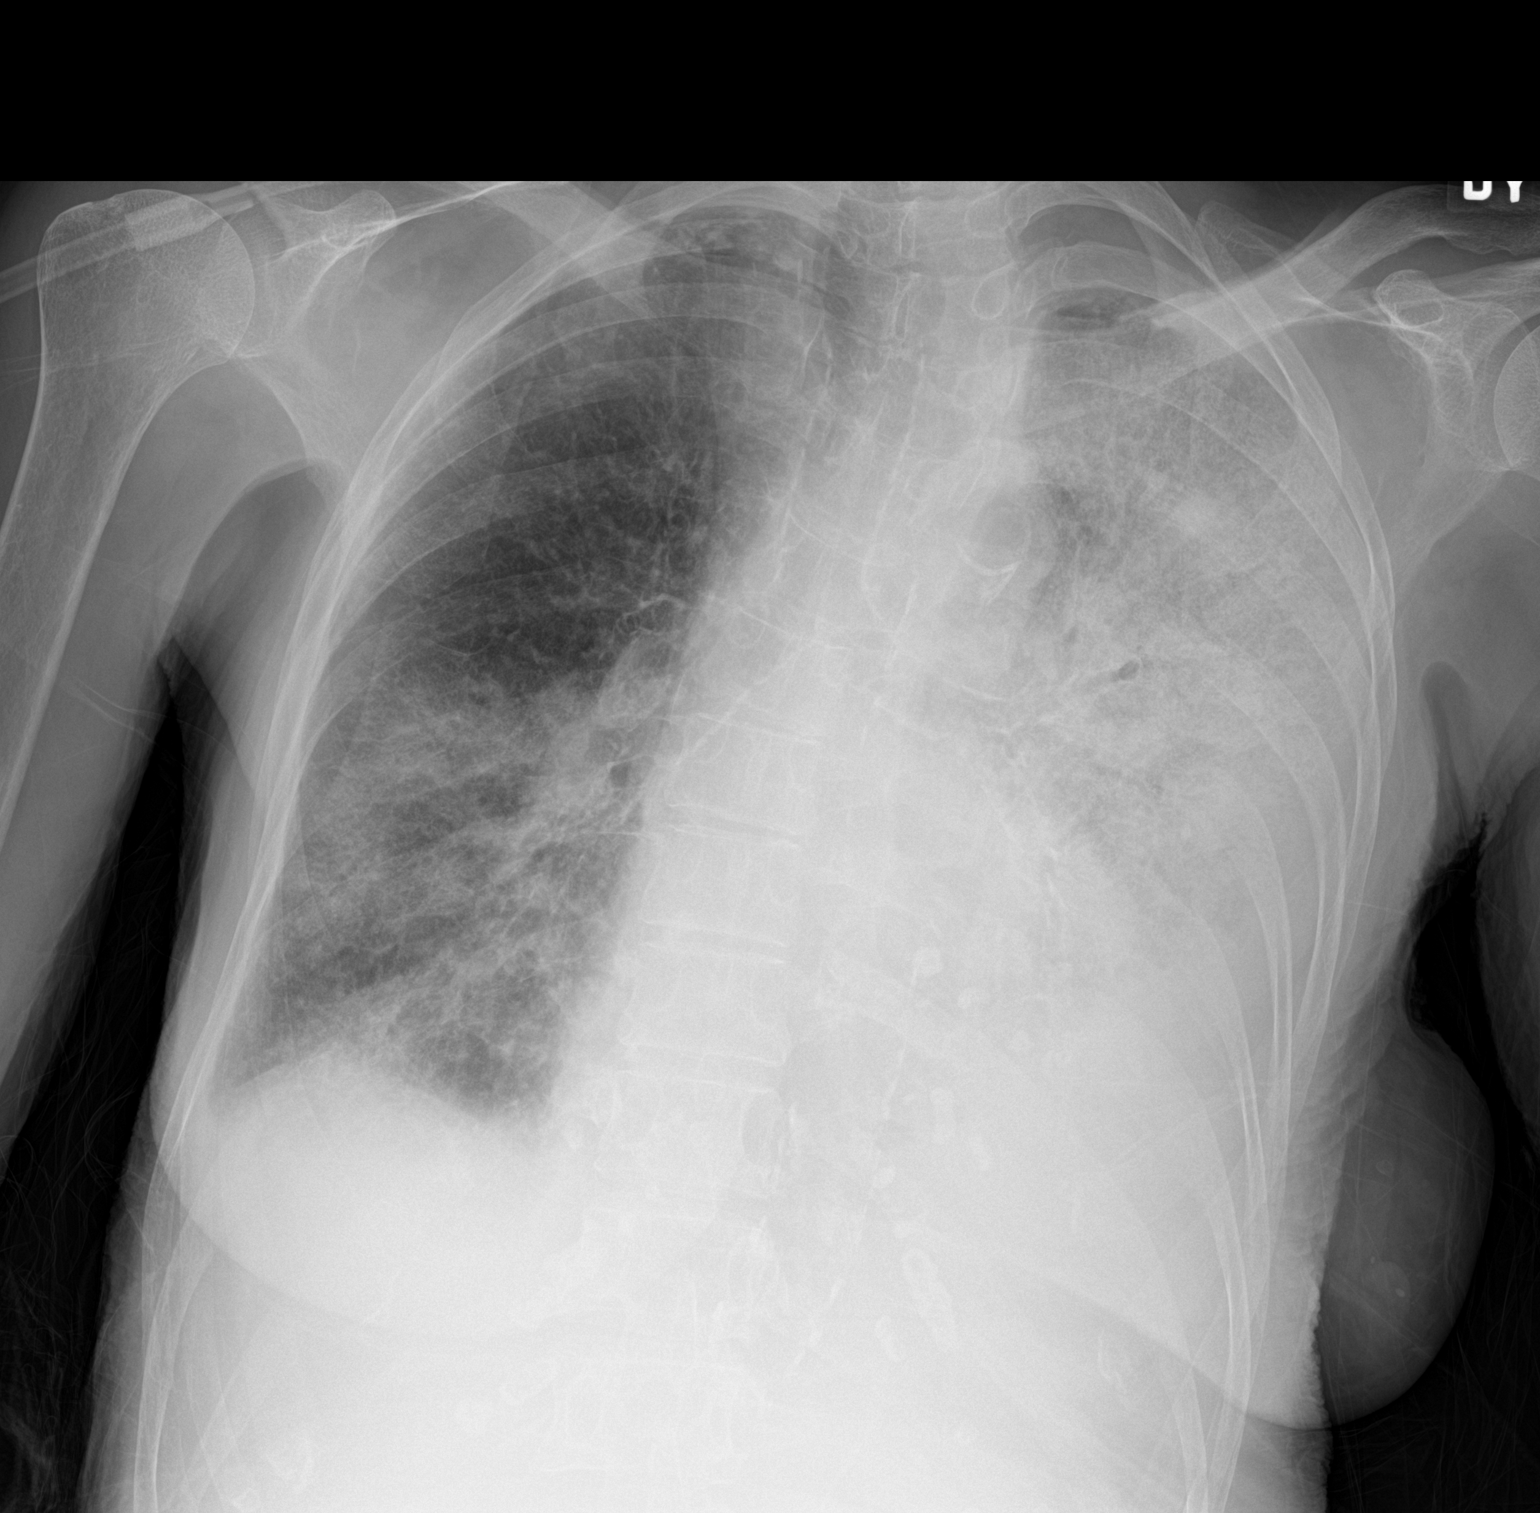

[2 of 2 positions shown; findings below may reference images not displayed]

FINDINGS: Increased right basilar and left upper lobe consolidation is noted
with near complete opacification of the left hemi thorax. Moderate
enlargement of the cardiac silhouette reidentified. Atheromatous
aortic calcification is noted. Small bilateral pleural effusions.
IMPRESSION: Increased right lower lobe and left upper lobe consolidation
compatible with worsening pneumonia. ARDS could appear similar.

## 2016-06-09 IMAGING — CR DG CHEST 2V
1 series · 2 of 2 positions shown · non-contrast
Comparison: 09/23/2014

CLINICAL DATA: Cough, congestion, congestion

EXAM:
CHEST  2 VIEW

[Series 1: dg chest 2 view · 0.14mm/px · 2 of 2 slices shown]
[im 1/2]
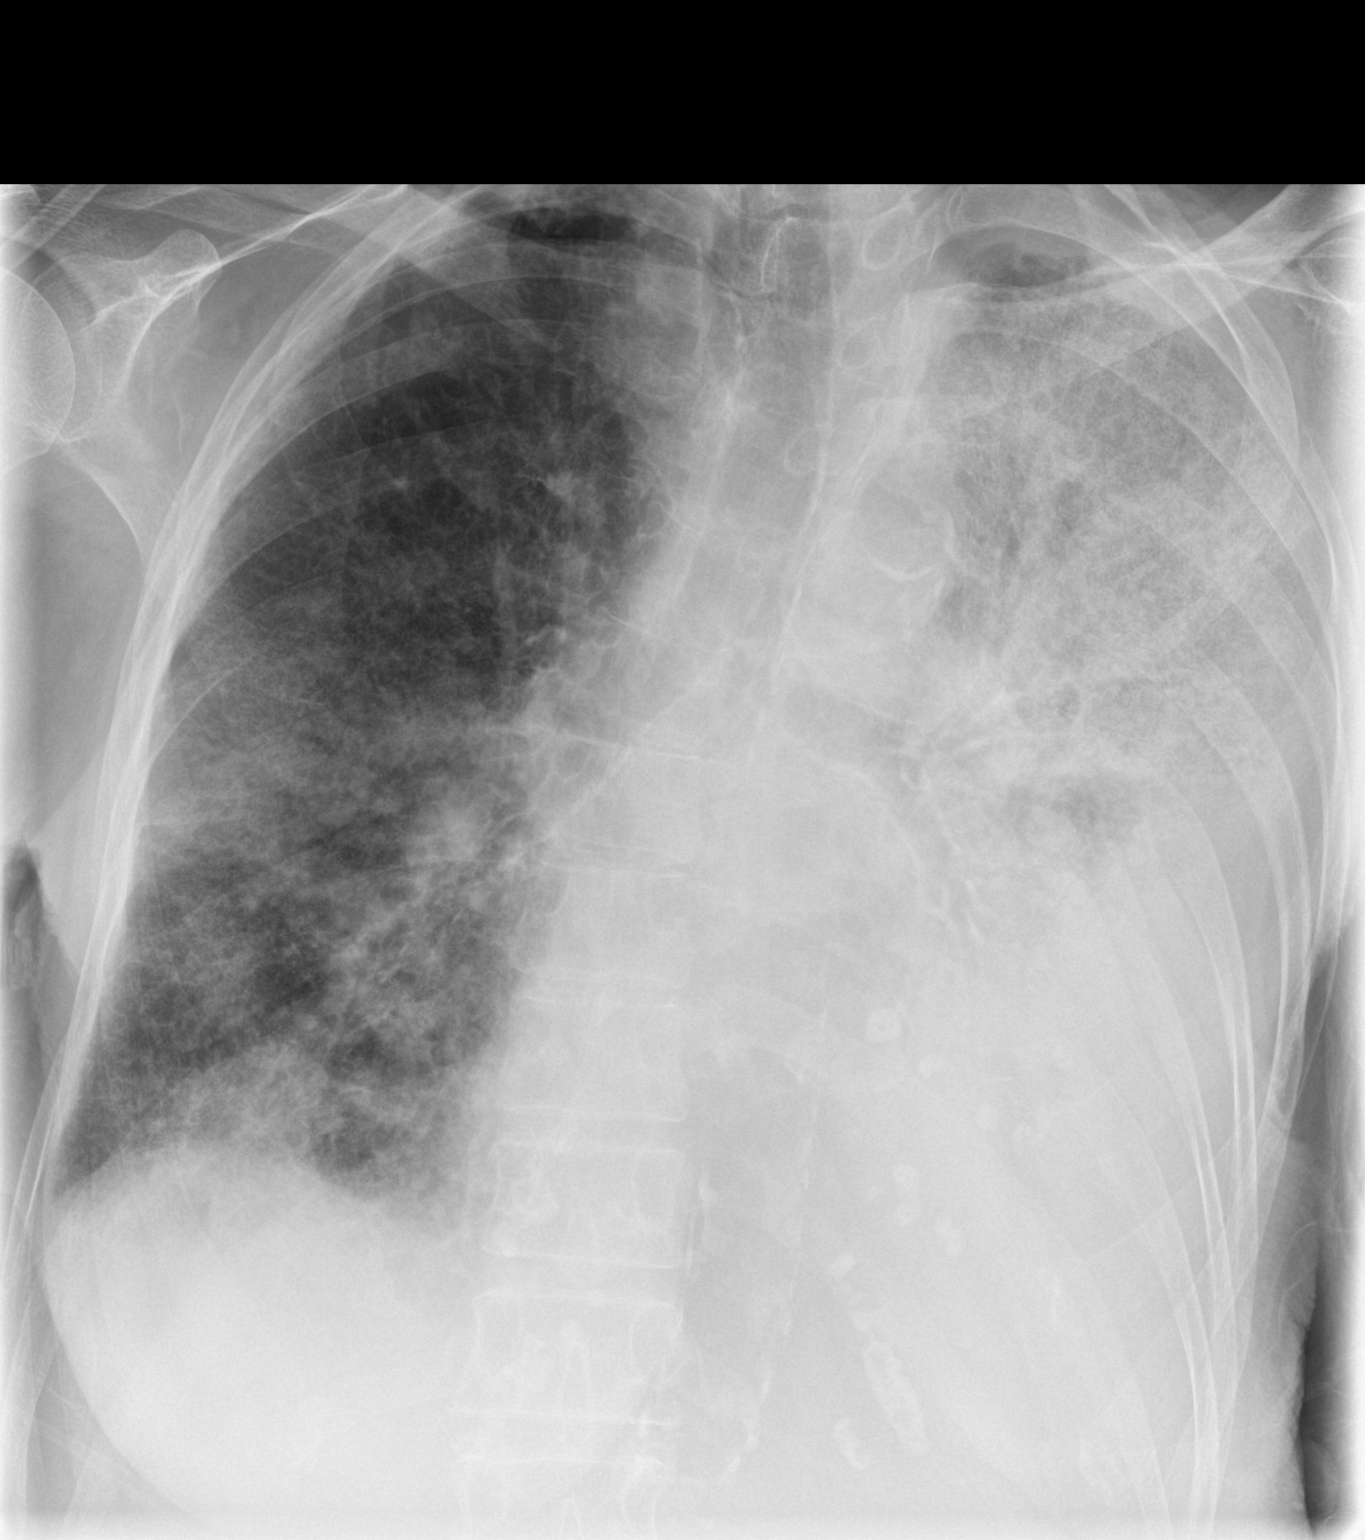
[im 2/2]
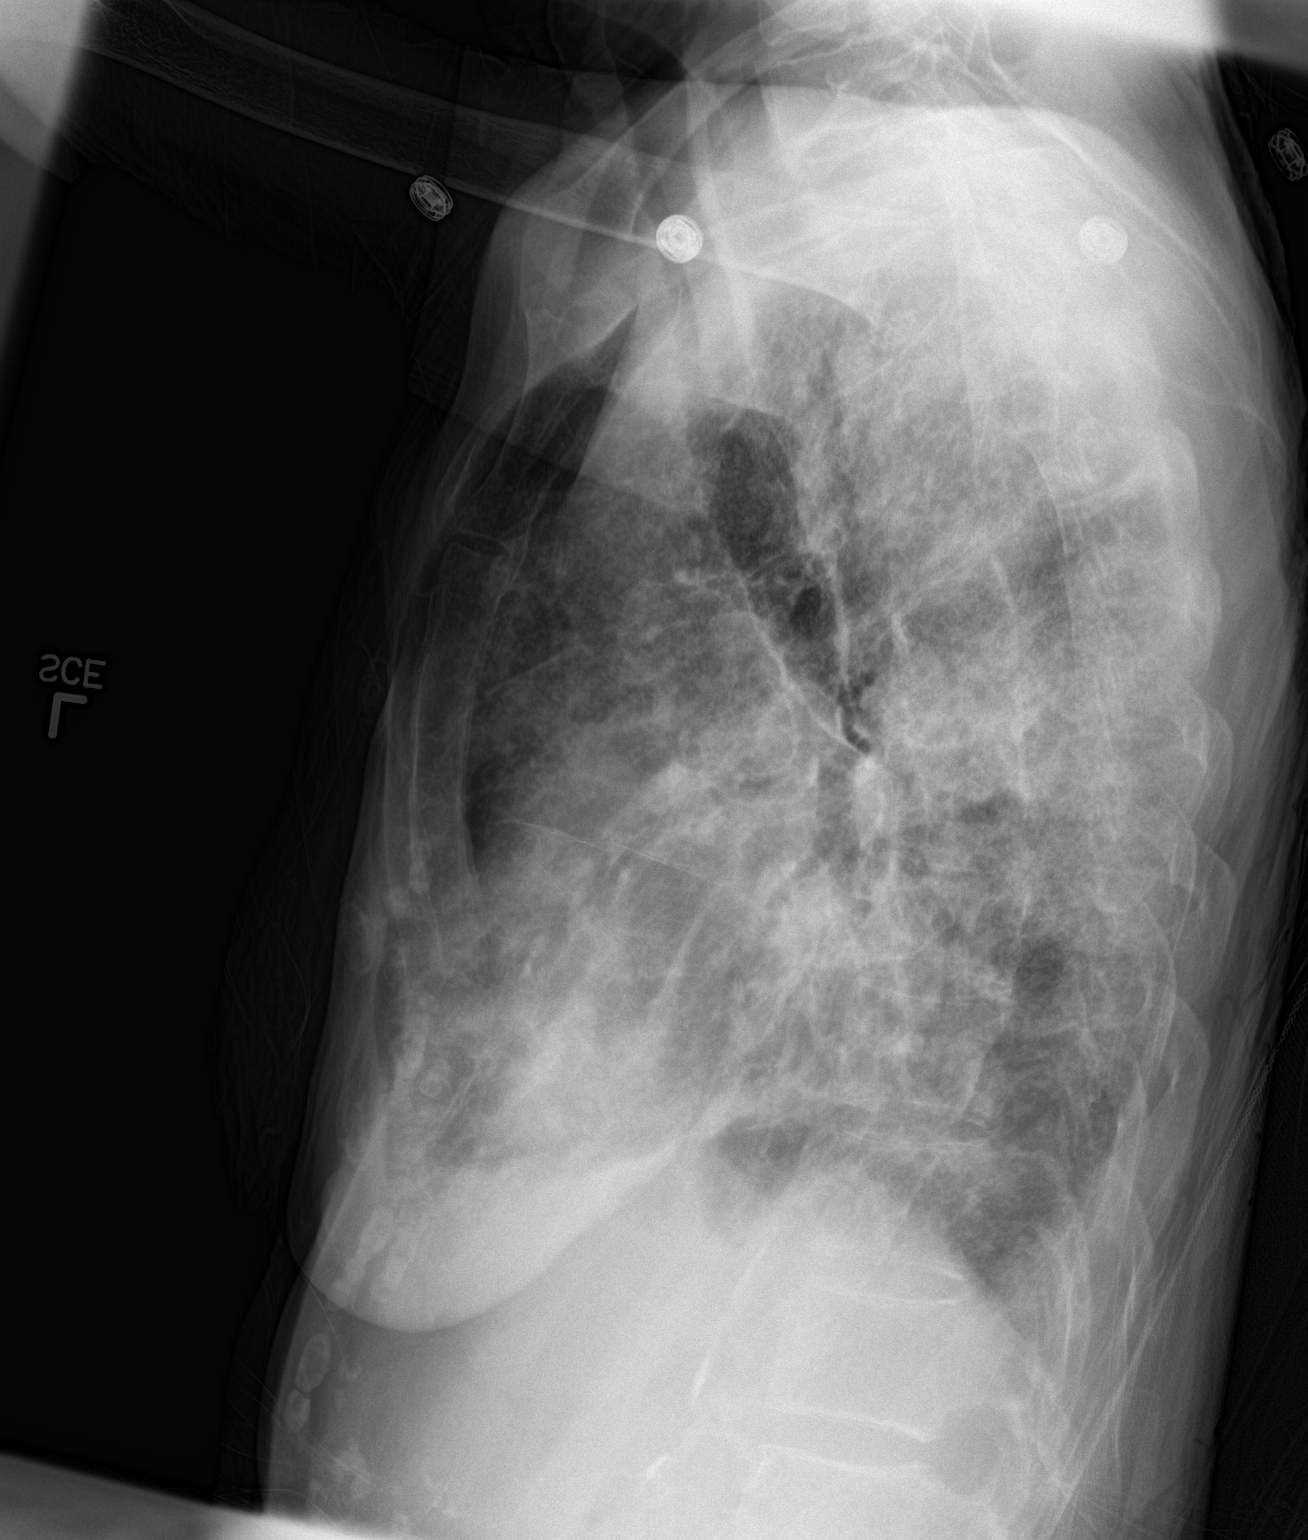

[2 of 2 positions shown; findings below may reference images not displayed]

FINDINGS: Cardiomediastinal silhouette is stable. Persistent airspace
opacification of left lung and probable small left pleural effusion.
Patchy airspace opacification in right lower lobe. Findings highly
suspicious for bilateral pneumonia or ARDS. Atherosclerotic
calcifications of thoracic aorta.
IMPRESSION: Persistent airspace opacification of left lung and probable small
left pleural effusion. Patchy airspace opacification in right lower
lobe. Findings highly suspicious for bilateral pneumonia or ARDS.

## 2016-06-29 IMAGING — RF DG ESOPHAGUS
1 series · 1 of 1 positions shown · non-contrast
Comparison: None .

CLINICAL DATA: Dysphagia.

EXAM:
ESOPHOGRAM/BARIUM SWALLOW
TECHNIQUE: Single contrast examination was performed using  thin barium.
FLUOROSCOPY TIME:  Fluoroscopy Time:  1 minutes 36 seconds
Number of Acquired Images:  1

[Series 1: fluoro_barium 2fps_bw · 0.19mm/px · 1 of 1 slices shown]
[im 1/1]
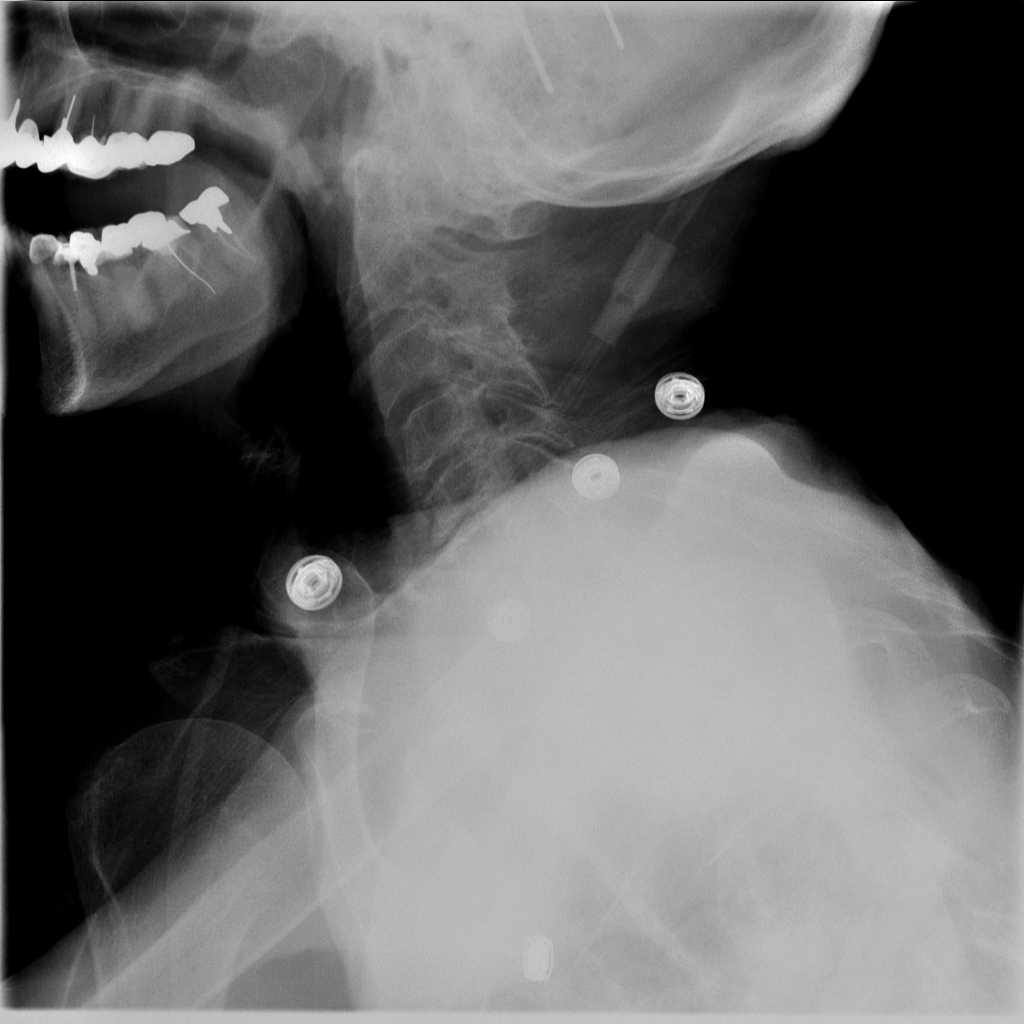

[1 of 1 positions shown; findings below may reference images not displayed]

FINDINGS: Multiple attempts were made it having the patient drank barium
including significant dilution of the of barium. The patient could
not drank the barium. Exam was terminated.
IMPRESSION: Patient could not drink barium. Exam terminated. Repeat attempt can
be performed as needed.
